# Patient Record
Sex: Male | Born: 1950 | Race: Black or African American | Hispanic: No | Marital: Married | Smoking: Never smoker
Health system: Southern US, Community
[De-identification: ages and names within clinical notes are randomized; demographics above are authoritative.]

## PROBLEM LIST (undated history)

## (undated) DIAGNOSIS — R600 Localized edema: Secondary | ICD-10-CM

## (undated) DIAGNOSIS — F329 Major depressive disorder, single episode, unspecified: Secondary | ICD-10-CM

## (undated) DIAGNOSIS — I639 Cerebral infarction, unspecified: Secondary | ICD-10-CM

## (undated) DIAGNOSIS — M51369 Other intervertebral disc degeneration, lumbar region without mention of lumbar back pain or lower extremity pain: Secondary | ICD-10-CM

## (undated) DIAGNOSIS — C61 Malignant neoplasm of prostate: Principal | ICD-10-CM

## (undated) DIAGNOSIS — M5136 Other intervertebral disc degeneration, lumbar region: Secondary | ICD-10-CM

## (undated) DIAGNOSIS — N189 Chronic kidney disease, unspecified: Secondary | ICD-10-CM

## (undated) DIAGNOSIS — I10 Essential (primary) hypertension: Secondary | ICD-10-CM

## (undated) DIAGNOSIS — K219 Gastro-esophageal reflux disease without esophagitis: Secondary | ICD-10-CM

## (undated) DIAGNOSIS — F32A Depression, unspecified: Secondary | ICD-10-CM

## (undated) DIAGNOSIS — Z923 Personal history of irradiation: Secondary | ICD-10-CM

## (undated) DIAGNOSIS — E785 Hyperlipidemia, unspecified: Secondary | ICD-10-CM

## (undated) HISTORY — DX: Personal history of irradiation: Z92.3

---
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
## 2005-09-02 ENCOUNTER — Emergency Department (HOSPITAL_COMMUNITY): Admission: EM | Admit: 2005-09-02 | Discharge: 2005-09-02 | Payer: Self-pay | Admitting: Emergency Medicine

## 2006-05-20 ENCOUNTER — Emergency Department (HOSPITAL_COMMUNITY): Admission: EM | Admit: 2006-05-20 | Discharge: 2006-05-20 | Payer: Self-pay | Admitting: Emergency Medicine

## 2007-04-10 ENCOUNTER — Emergency Department (HOSPITAL_COMMUNITY): Admission: EM | Admit: 2007-04-10 | Discharge: 2007-04-10 | Payer: Self-pay | Admitting: Emergency Medicine

## 2010-10-05 ENCOUNTER — Emergency Department (HOSPITAL_COMMUNITY): Payer: 59

## 2010-10-05 ENCOUNTER — Emergency Department (HOSPITAL_COMMUNITY)
Admission: EM | Admit: 2010-10-05 | Discharge: 2010-10-05 | Disposition: A | Discharge status: Home / Self Care | Payer: 59 | Attending: Emergency Medicine | Admitting: Emergency Medicine

## 2010-10-05 DIAGNOSIS — I10 Essential (primary) hypertension: Secondary | ICD-10-CM | POA: Insufficient documentation

## 2010-10-05 DIAGNOSIS — G8929 Other chronic pain: Secondary | ICD-10-CM | POA: Insufficient documentation

## 2010-10-05 DIAGNOSIS — E1169 Type 2 diabetes mellitus with other specified complication: Secondary | ICD-10-CM | POA: Insufficient documentation

## 2010-10-05 LAB — URINALYSIS, ROUTINE W REFLEX MICROSCOPIC
Ketones, ur: NEGATIVE mg/dL
Leukocytes, UA: NEGATIVE
Nitrite: NEGATIVE
Specific Gravity, Urine: 1.02 (ref 1.005–1.030)

## 2010-10-05 LAB — CBC
HCT: 43 % (ref 39.0–52.0)
Hemoglobin: 14.4 g/dL (ref 13.0–17.0)
MCV: 76.4 fL — ABNORMAL LOW (ref 78.0–100.0)
Platelets: 228 10*3/uL (ref 150–400)
RDW: 12.6 % (ref 11.5–15.5)

## 2010-10-05 LAB — BASIC METABOLIC PANEL
BUN: 14 mg/dL (ref 6–23)
CO2: 33 mEq/L — ABNORMAL HIGH (ref 19–32)
Chloride: 96 mEq/L (ref 96–112)
GFR calc Af Amer: >60 mL/min (ref >60)
Glucose, Bld: 377 mg/dL — ABNORMAL HIGH (ref 70–99)
Potassium: 3.7 mEq/L (ref 3.5–5.1)
Sodium: 135 mEq/L (ref 135–145)

## 2010-10-05 LAB — DIFFERENTIAL
Basophils Relative: 0 % (ref 0–1)
Neutro Abs: 2.1 10*3/uL (ref 1.7–7.7)

## 2011-03-04 LAB — BASIC METABOLIC PANEL
Calcium: 9.3
GFR calc non Af Amer: >60
Potassium: 4.4
Sodium: 135

## 2011-07-09 ENCOUNTER — Emergency Department (HOSPITAL_COMMUNITY)
Admission: EM | Admit: 2011-07-09 | Discharge: 2011-07-09 | Disposition: A | Discharge status: Home / Self Care | Payer: 59 | Attending: Emergency Medicine | Admitting: Emergency Medicine

## 2011-07-09 ENCOUNTER — Encounter (HOSPITAL_COMMUNITY): Payer: Self-pay | Admitting: Emergency Medicine

## 2011-07-09 ENCOUNTER — Emergency Department (HOSPITAL_COMMUNITY): Payer: 59

## 2011-07-09 DIAGNOSIS — K1379 Other lesions of oral mucosa: Secondary | ICD-10-CM

## 2011-07-09 DIAGNOSIS — R05 Cough: Secondary | ICD-10-CM

## 2011-07-09 DIAGNOSIS — I10 Essential (primary) hypertension: Secondary | ICD-10-CM | POA: Insufficient documentation

## 2011-07-09 DIAGNOSIS — R059 Cough, unspecified: Secondary | ICD-10-CM | POA: Insufficient documentation

## 2011-07-09 DIAGNOSIS — Z79899 Other long term (current) drug therapy: Secondary | ICD-10-CM | POA: Insufficient documentation

## 2011-07-09 DIAGNOSIS — K1370 Unspecified lesions of oral mucosa: Secondary | ICD-10-CM | POA: Insufficient documentation

## 2011-07-09 DIAGNOSIS — E119 Type 2 diabetes mellitus without complications: Secondary | ICD-10-CM | POA: Insufficient documentation

## 2011-07-09 HISTORY — DX: Essential (primary) hypertension: I10

## 2011-07-09 MED ORDER — AMOXICILLIN 500 MG PO CAPS: 500.0000 mg | ORAL_CAPSULE | Freq: Three times a day (TID) | ORAL | Status: AC

## 2011-07-09 MED ORDER — DEXAMETHASONE 6 MG PO TABS: ORAL_TABLET | ORAL | Status: AC

## 2011-07-09 MED ORDER — PROMETHAZINE-CODEINE 6.25-10 MG/5ML PO SYRP
ORAL_SOLUTION | ORAL | Status: DC
Start: 2011-07-09 — End: 2012-06-04

## 2011-07-09 NOTE — ED Provider Notes (Signed)
History     CSN: [id number]  Arrival date & time 07/09/11  1639   First MD Initiated Contact with Patient 07/09/11 1758      Chief Complaint  Patient presents with  . Cough    (Consider location/radiation/quality/duration/timing/severity/associated sxs/prior treatment) HPI Comments: Patient reports 6 weeks of cough. He denies any hemoptysis. He denies fever. He denies any surgery or procedures on the neck or throat. He has had mild nasal congestion. He states he has tried various medications over the counter, without success. He presents at this point for evaluation of his cough.  The history is provided by the patient.    Past Medical History  Diagnosis Date  . Hypertension   . Diabetes mellitus     History reviewed. No pertinent past surgical history.  No family history on file.  History  Substance Use Topics  . Smoking status: Never Smoker   . Smokeless tobacco: Not on file  . Alcohol Use: No      Review of Systems  Constitutional: Negative for activity change.       All ROS Neg except as noted in HPI  HENT: Positive for postnasal drip. Negative for nosebleeds and neck pain.   Eyes: Negative for photophobia and discharge.  Respiratory: Positive for cough. Negative for shortness of breath and wheezing.   Cardiovascular: Negative for chest pain and palpitations.  Gastrointestinal: Negative for abdominal pain and blood in stool.  Genitourinary: Negative for dysuria, frequency and hematuria.  Musculoskeletal: Negative for back pain and arthralgias.  Skin: Negative.   Neurological: Negative for dizziness, seizures and speech difficulty.  Psychiatric/Behavioral: Negative for hallucinations and confusion.    Allergies  Review of patient's allergies indicates no known allergies.  Home Medications   Current Outpatient Rx  Name Route Sig Dispense Refill  . AMLODIPINE BESYLATE 5 MG PO TABS Oral Take 5 mg by mouth daily.    . ASPIRIN EC 81 MG PO TBEC Oral Take 81  mg by mouth every morning.    Marland Kitchen BENZONATATE 100 MG PO CAPS Oral Take 100 mg by mouth every 8 (eight) hours as needed. For cough    . GLIPIZIDE ER 10 MG PO TB24 Oral Take 10 mg by mouth every morning.    . INSULIN ISOPHANE HUMAN 100 UNIT/ML [place] SUSP Subcutaneous Inject 10 Units into the skin at bedtime.    Marland Kitchen LISINOPRIL-HYDROCHLOROTHIAZIDE 20-12.5 MG PO TABS Oral Take 1 tablet by mouth 2 (two) times daily.    Marland Kitchen METFORMIN HCL ER 500 MG PO TB24 Oral Take 500 mg by mouth 2 (two) times daily.    Marland Kitchen SIMVASTATIN 20 MG PO TABS Oral Take 20 mg by mouth at bedtime.      BP 172/63  Pulse 97  Temp 98.3 F (36.8 C)  Resp 20  Ht 5' 8.5" (1.74 m)  Wt 190 lb (86.183 kg)  BMI 28.47 kg/m2  SpO2 98%  Physical Exam  Nursing note and vitals reviewed. Constitutional: He is oriented to person, place, and time. He appears well-developed and well-nourished.  Non-toxic appearance.  HENT:  Head: Normocephalic.  Right Ear: Tympanic membrane and external ear normal.  Left Ear: Tympanic membrane and external ear normal.       There is no increased redness of the posterior pharynx. The uvula however is elongated. The airway is patent. There is some mild nasal congestion present.  Eyes: EOM and lids are normal. Pupils are equal, round, and reactive to light.  Neck: Normal range of motion.  Neck supple. Carotid bruit is not present.  Cardiovascular: Normal rate, regular rhythm, normal heart sounds, intact distal pulses and normal pulses.   Pulmonary/Chest: Effort normal and breath sounds normal. No respiratory distress. He has no wheezes. He has no rales.  Abdominal: Soft. Bowel sounds are normal. There is no tenderness. There is no guarding.  Musculoskeletal: Normal range of motion.  Lymphadenopathy:       Head (right side): No submandibular adenopathy present.       Head (left side): No submandibular adenopathy present.    He has no cervical adenopathy.  Neurological: He is alert and oriented to person, place, and  time. He has normal strength. No cranial nerve deficit or sensory deficit.  Skin: Skin is warm and dry.  Psychiatric: He has a normal mood and affect. His speech is normal.    ED Course  Procedures (including critical care time)  Labs Reviewed - No data to display No results found.   No diagnosis found.    MDM  I have reviewed nursing notes, vital signs, and all appropriate lab and imaging results for this patient. The chest x-ray is within normal limits. Suspect the source of the cough is an elongated uvula. Patient advised to use dexamethasone for  6 days only. Promethazine cough medication at bedtime. Amoxicillin 500 mg 3 times daily. Patient further advised to see the ENT specialist if not improving.       Kathie Dike, PA 07/09/11 1852  Kathie Dike, [place] 07/09/11 [phone]

## 2011-07-09 NOTE — ED Provider Notes (Signed)
Medical screening examination/treatment/procedure(s) were performed by non-physician practitioner and as supervising physician I was immediately available for consultation/collaboration.  Jumar Greenstreet S. Jasnoor Trussell, MD 07/09/11 2153 

## 2011-07-09 NOTE — ED Notes (Signed)
Patient transported to X-ray 

## 2011-07-09 NOTE — ED Notes (Signed)
Cough for 6 weeks. Feels like"coming from my throat;".  Does not feel he has chest congestion.  Alert, Denies nasal congestion.

## 2011-07-09 NOTE — ED Notes (Signed)
Pt c/o chronic cough x 6 weeks. Denies n/v/d.

## 2011-07-09 NOTE — Discharge Instructions (Signed)
Your chest x-ray is within normal limits. Here vital signs are stable. Your oxygen level is within normal limits. It appears the source of your cough is from your uvula in the back of your throat. Please use the medications prescribed, and if not improving in 5-6 days please see the specialists listed above for additional assistance.

## 2012-03-25 HISTORY — PX: PROSTATE BIOPSY: SHX241

## 2012-03-29 ENCOUNTER — Other Ambulatory Visit (HOSPITAL_COMMUNITY): Payer: Self-pay | Admitting: Urology

## 2012-03-29 DIAGNOSIS — C61 Malignant neoplasm of prostate: Secondary | ICD-10-CM

## 2012-04-12 ENCOUNTER — Encounter (HOSPITAL_COMMUNITY)
Admission: RE | Admit: 2012-04-12 | Discharge: 2012-04-12 | Disposition: A | Discharge status: Home / Self Care | Payer: 59 | Source: Ambulatory Visit | Attending: Urology | Admitting: Urology

## 2012-04-12 DIAGNOSIS — C61 Malignant neoplasm of prostate: Secondary | ICD-10-CM | POA: Insufficient documentation

## 2012-04-12 MED ORDER — TECHNETIUM TC 99M MEDRONATE IV KIT
25.0000 | PACK | Freq: Once | INTRAVENOUS | Status: AC | PRN
  Administered 2012-04-12: 25 via INTRAVENOUS

## 2012-04-15 ENCOUNTER — Other Ambulatory Visit (HOSPITAL_COMMUNITY): Payer: Self-pay | Admitting: Urology

## 2012-04-15 ENCOUNTER — Ambulatory Visit (HOSPITAL_COMMUNITY)
Admission: RE | Admit: 2012-04-15 | Discharge: 2012-04-15 | Disposition: A | Discharge status: Home / Self Care | Payer: 59 | Source: Ambulatory Visit | Attending: Urology | Admitting: Urology

## 2012-04-15 DIAGNOSIS — M51379 Other intervertebral disc degeneration, lumbosacral region without mention of lumbar back pain or lower extremity pain: Secondary | ICD-10-CM | POA: Insufficient documentation

## 2012-04-15 DIAGNOSIS — M549 Dorsalgia, unspecified: Secondary | ICD-10-CM

## 2012-04-15 DIAGNOSIS — M545 Low back pain, unspecified: Secondary | ICD-10-CM | POA: Insufficient documentation

## 2012-04-15 DIAGNOSIS — M5137 Other intervertebral disc degeneration, lumbosacral region: Secondary | ICD-10-CM | POA: Insufficient documentation

## 2012-04-26 ENCOUNTER — Other Ambulatory Visit: Payer: Self-pay | Admitting: Urology

## 2012-06-03 ENCOUNTER — Encounter (HOSPITAL_COMMUNITY): Payer: Self-pay | Admitting: Pharmacy Technician

## 2012-06-04 NOTE — Patient Instructions (Addendum)
Jim [name]  06/04/2012   Your procedure is scheduled on: 06/11/12   Report to [facility] at 0515 AM.  Call this number if you have problems the morning of surgery: [phone]   Remember:   Do not eat food or drink liquids after midnight.   Take these medicines the morning of surgery with A SIP OF WATER:    Do not wear jewelry,   Do not wear lotions, powders, or perfumes.    . Men may shave face and neck.  Do not bring valuables to the hospital.  Contacts, dentures or bridgework may not be worn into surgery.  Leave suitcase in the car. After surgery it may be brought to your room.  For patients admitted to the hospital, checkout time is 11:00 AM the day of  discharge.   SEE CHG INSTRUCTION SHEET    Please read over the following fact sheets that you were given: MRSA Information, coughing and deep breathing exercises, leg exercises, Blood Transfusion Fact Sheet                Failure to comply with these instructions may result in cancellation of your surgery.                  Patient Signature _________________________________               Nurse Signature _________________________________

## 2012-06-07 ENCOUNTER — Encounter (HOSPITAL_COMMUNITY)
Admission: RE | Admit: 2012-06-07 | Discharge: 2012-06-07 | Disposition: A | Discharge status: Home / Self Care | Payer: 59 | Source: Ambulatory Visit | Attending: Urology | Admitting: Urology

## 2012-06-07 ENCOUNTER — Encounter (HOSPITAL_COMMUNITY): Payer: Self-pay

## 2012-06-07 HISTORY — DX: Other intervertebral disc degeneration, lumbar region: M51.36

## 2012-06-07 HISTORY — DX: Chronic kidney disease, unspecified: N18.9

## 2012-06-07 HISTORY — DX: Other intervertebral disc degeneration, lumbar region without mention of lumbar back pain or lower extremity pain: M51.369

## 2012-06-07 HISTORY — DX: Gastro-esophageal reflux disease without esophagitis: K21.9

## 2012-06-07 LAB — BASIC METABOLIC PANEL
BUN: 17 mg/dL (ref 6–23)
Calcium: 9.5 mg/dL (ref 8.4–10.5)
GFR calc Af Amer: 77 mL/min — ABNORMAL LOW (ref >90)
GFR calc non Af Amer: 67 mL/min — ABNORMAL LOW (ref >90)
Glucose, Bld: 188 mg/dL — ABNORMAL HIGH (ref 70–99)
Potassium: 4 mEq/L (ref 3.5–5.1)
Sodium: 138 mEq/L (ref 135–145)

## 2012-06-07 LAB — SURGICAL PCR SCREEN
MRSA, PCR: NEGATIVE
Staphylococcus aureus: NEGATIVE

## 2012-06-07 LAB — CBC
MCH: 26.2 pg (ref 26.0–34.0)
MCHC: 33.9 g/dL (ref 30.0–36.0)
RDW: 12.5 % (ref 11.5–15.5)

## 2012-06-07 NOTE — Progress Notes (Signed)
06/07/12 1242  OBSTRUCTIVE SLEEP APNEA  Have you ever been diagnosed with sleep apnea through a sleep study? No  Do you snore loudly (loud enough to be heard through closed doors)?  1  Do you often feel tired, fatigued, or sleepy during the daytime? 1  Has anyone observed you stop breathing during your sleep? 1  Do you have, or are you being treated for high blood pressure? 1  BMI more than 35 kg/m2? 0  Age over 64 years old? 1  Neck circumference greater than 40 cm/18 inches? 0  Gender: 1  Obstructive Sleep Apnea Score 6

## 2012-06-11 ENCOUNTER — Inpatient Hospital Stay (HOSPITAL_COMMUNITY): Payer: 59 | Admitting: Anesthesiology

## 2012-06-11 ENCOUNTER — Encounter (HOSPITAL_COMMUNITY)
Admission: RE | Disposition: A | Discharge status: Home / Self Care | Payer: Self-pay | Source: Ambulatory Visit | Attending: Urology

## 2012-06-11 ENCOUNTER — Encounter (HOSPITAL_COMMUNITY): Payer: Self-pay | Admitting: *Deleted

## 2012-06-11 ENCOUNTER — Encounter (HOSPITAL_COMMUNITY): Payer: Self-pay | Admitting: Anesthesiology

## 2012-06-11 ENCOUNTER — Inpatient Hospital Stay (HOSPITAL_COMMUNITY)
Admission: RE | Admit: 2012-06-11 | Discharge: 2012-06-13 | DRG: 708 | Disposition: A | Discharge status: Home / Self Care | Payer: 59 | Source: Ambulatory Visit | Attending: Urology | Admitting: Urology

## 2012-06-11 DIAGNOSIS — Z01812 Encounter for preprocedural laboratory examination: Secondary | ICD-10-CM

## 2012-06-11 DIAGNOSIS — E119 Type 2 diabetes mellitus without complications: Secondary | ICD-10-CM | POA: Diagnosis present

## 2012-06-11 DIAGNOSIS — K219 Gastro-esophageal reflux disease without esophagitis: Secondary | ICD-10-CM | POA: Diagnosis present

## 2012-06-11 DIAGNOSIS — N281 Cyst of kidney, acquired: Secondary | ICD-10-CM | POA: Diagnosis present

## 2012-06-11 DIAGNOSIS — Z79899 Other long term (current) drug therapy: Secondary | ICD-10-CM

## 2012-06-11 DIAGNOSIS — C61 Malignant neoplasm of prostate: Principal | ICD-10-CM | POA: Diagnosis present

## 2012-06-11 DIAGNOSIS — I10 Essential (primary) hypertension: Secondary | ICD-10-CM | POA: Diagnosis present

## 2012-06-11 DIAGNOSIS — M5137 Other intervertebral disc degeneration, lumbosacral region: Secondary | ICD-10-CM | POA: Diagnosis present

## 2012-06-11 DIAGNOSIS — M51379 Other intervertebral disc degeneration, lumbosacral region without mention of lumbar back pain or lower extremity pain: Secondary | ICD-10-CM | POA: Diagnosis present

## 2012-06-11 DIAGNOSIS — G8929 Other chronic pain: Secondary | ICD-10-CM | POA: Diagnosis present

## 2012-06-11 DIAGNOSIS — Z794 Long term (current) use of insulin: Secondary | ICD-10-CM

## 2012-06-11 DIAGNOSIS — E785 Hyperlipidemia, unspecified: Secondary | ICD-10-CM | POA: Diagnosis present

## 2012-06-11 HISTORY — PX: ROBOTIC ASSITED PARTIAL NEPHRECTOMY: SHX6087

## 2012-06-11 HISTORY — PX: LYMPHADENECTOMY: SHX5960

## 2012-06-11 HISTORY — PX: ROBOT ASSISTED LAPAROSCOPIC RADICAL PROSTATECTOMY: SHX5141

## 2012-06-11 HISTORY — DX: Malignant neoplasm of prostate: C61

## 2012-06-11 LAB — GLUCOSE, CAPILLARY
Glucose-Capillary: 157 mg/dL — ABNORMAL HIGH (ref 70–99)
Glucose-Capillary: 200 mg/dL — ABNORMAL HIGH (ref 70–99)

## 2012-06-11 LAB — BASIC METABOLIC PANEL
BUN: 16 mg/dL (ref 6–23)
Chloride: 100 mEq/L (ref 96–112)
Creatinine, Ser: 1.23 mg/dL (ref 0.50–1.35)
GFR calc non Af Amer: 61 mL/min — ABNORMAL LOW (ref >90)
Glucose, Bld: 221 mg/dL — ABNORMAL HIGH (ref 70–99)
Potassium: 3.2 mEq/L — ABNORMAL LOW (ref 3.5–5.1)

## 2012-06-11 LAB — TYPE AND SCREEN

## 2012-06-11 LAB — ABO/RH: ABO/RH(D): O POS

## 2012-06-11 SURGERY — ROBOTIC ASSISTED LAPAROSCOPIC RADICAL PROSTATECTOMY
Anesthesia: General | Wound class: Clean Contaminated

## 2012-06-11 MED ORDER — MIDAZOLAM HCL 5 MG/5ML IJ SOLN
INTRAMUSCULAR | Status: DC | PRN
  Administered 2012-06-11: 2 mg via INTRAVENOUS

## 2012-06-11 MED ORDER — METOPROLOL TARTRATE 1 MG/ML IV SOLN
2.0000 mg | Freq: Once | INTRAVENOUS | Status: AC
  Administered 2012-06-11: 2 mg via INTRAVENOUS

## 2012-06-11 MED ORDER — POTASSIUM CHLORIDE IN NACL 20-0.9 MEQ/L-% IV SOLN
INTRAVENOUS | Status: DC
  Administered 2012-06-11 – 2012-06-13 (×4): via INTRAVENOUS
  Filled 2012-06-11 (×5): qty 1000

## 2012-06-11 MED ORDER — LACTATED RINGERS IV SOLN
INTRAVENOUS | Status: DC | PRN
  Administered 2012-06-11 (×3): via INTRAVENOUS
  Administered 2012-06-11: 1000 mL

## 2012-06-11 MED ORDER — INDOCYANINE GREEN 25 MG IV SOLR
INTRAVENOUS | Status: DC | PRN
  Administered 2012-06-11: .8 mg via INTRAVENOUS

## 2012-06-11 MED ORDER — AMLODIPINE BESYLATE 10 MG PO TABS
10.0000 mg | ORAL_TABLET | Freq: Every morning | ORAL | Status: DC
Start: 2012-06-12 — End: 2012-06-13
  Administered 2012-06-12 – 2012-06-13 (×2): 10 mg via ORAL
  Filled 2012-06-11 (×2): qty 1

## 2012-06-11 MED ORDER — DOCUSATE SODIUM 100 MG PO CAPS
100.0000 mg | ORAL_CAPSULE | Freq: Two times a day (BID) | ORAL | Status: DC
  Administered 2012-06-11 – 2012-06-13 (×4): 100 mg via ORAL
  Filled 2012-06-11 (×5): qty 1

## 2012-06-11 MED ORDER — HYDROMORPHONE HCL PF 1 MG/ML IJ SOLN
0.2500 mg | INTRAMUSCULAR | Status: DC | PRN
  Administered 2012-06-11: 0.5 mg via INTRAVENOUS

## 2012-06-11 MED ORDER — INSULIN ASPART 100 UNIT/ML ~~LOC~~ SOLN: 0.0000 [IU] | Freq: Every day | SUBCUTANEOUS | Status: DC

## 2012-06-11 MED ORDER — LOSARTAN POTASSIUM 50 MG PO TABS
50.0000 mg | ORAL_TABLET | Freq: Every day | ORAL | Status: DC
  Administered 2012-06-12 – 2012-06-13 (×2): 50 mg via ORAL
  Filled 2012-06-11 (×2): qty 1

## 2012-06-11 MED ORDER — HEPARIN SODIUM (PORCINE) 5000 UNIT/ML IJ SOLN
5000.0000 [IU] | Freq: Three times a day (TID) | INTRAMUSCULAR | Status: DC
  Administered 2012-06-11 – 2012-06-13 (×5): 5000 [IU] via SUBCUTANEOUS
  Filled 2012-06-11 (×8): qty 1

## 2012-06-11 MED ORDER — NEOSTIGMINE METHYLSULFATE 1 MG/ML IJ SOLN
INTRAMUSCULAR | Status: DC | PRN
  Administered 2012-06-11: 4 mg via INTRAVENOUS

## 2012-06-11 MED ORDER — ESMOLOL HCL 10 MG/ML IV SOLN
INTRAVENOUS | Status: DC | PRN
  Administered 2012-06-11: 30 mg via INTRAVENOUS

## 2012-06-11 MED ORDER — ONDANSETRON HCL 4 MG/2ML IJ SOLN
INTRAMUSCULAR | Status: DC | PRN
  Administered 2012-06-11: 4 mg via INTRAVENOUS

## 2012-06-11 MED ORDER — SIMVASTATIN 20 MG PO TABS
20.0000 mg | ORAL_TABLET | Freq: Every day | ORAL | Status: DC
  Administered 2012-06-11 – 2012-06-12 (×2): 20 mg via ORAL
  Filled 2012-06-11 (×3): qty 1

## 2012-06-11 MED ORDER — INSULIN ASPART 100 UNIT/ML ~~LOC~~ SOLN
0.0000 [IU] | Freq: Three times a day (TID) | SUBCUTANEOUS | Status: DC
  Administered 2012-06-11 – 2012-06-12 (×2): 3 [IU] via SUBCUTANEOUS
  Administered 2012-06-12 – 2012-06-13 (×3): 2 [IU] via SUBCUTANEOUS

## 2012-06-11 MED ORDER — ONDANSETRON HCL 4 MG/2ML IJ SOLN: 4.0000 mg | INTRAMUSCULAR | Status: DC | PRN

## 2012-06-11 MED ORDER — CISATRACURIUM BESYLATE (PF) 10 MG/5ML IV SOLN
INTRAVENOUS | Status: DC | PRN
  Administered 2012-06-11: 16 mg via INTRAVENOUS
  Administered 2012-06-11 (×2): 2 mg via INTRAVENOUS
  Administered 2012-06-11 (×4): 4 mg via INTRAVENOUS
  Administered 2012-06-11: 6 mg via INTRAVENOUS

## 2012-06-11 MED ORDER — LACTATED RINGERS IR SOLN
Status: DC | PRN
  Administered 2012-06-11: 1000 mL

## 2012-06-11 MED ORDER — HYDROCHLOROTHIAZIDE 12.5 MG PO CAPS
12.5000 mg | ORAL_CAPSULE | Freq: Every day | ORAL | Status: DC
  Administered 2012-06-12 – 2012-06-13 (×2): 12.5 mg via ORAL
  Filled 2012-06-11 (×2): qty 1

## 2012-06-11 MED ORDER — LIDOCAINE HCL (CARDIAC) 20 MG/ML IV SOLN
INTRAVENOUS | Status: DC | PRN
  Administered 2012-06-11: 100 mg via INTRAVENOUS

## 2012-06-11 MED ORDER — OXYCODONE HCL 5 MG PO TABS
5.0000 mg | ORAL_TABLET | ORAL | Status: DC | PRN
  Administered 2012-06-11 – 2012-06-12 (×4): 5 mg via ORAL
  Filled 2012-06-11 (×4): qty 1

## 2012-06-11 MED ORDER — SODIUM CHLORIDE 0.9 % IV BOLUS (SEPSIS)
1000.0000 mL | Freq: Once | INTRAVENOUS | Status: AC
  Administered 2012-06-11: 1000 mL via INTRAVENOUS

## 2012-06-11 MED ORDER — INDIGOTINDISULFONATE SODIUM 8 MG/ML IJ SOLN
INTRAMUSCULAR | Status: DC | PRN
  Administered 2012-06-11: 5 mL via INTRAVENOUS

## 2012-06-11 MED ORDER — HYDROMORPHONE HCL PF 1 MG/ML IJ SOLN
INTRAMUSCULAR | Status: DC | PRN
  Administered 2012-06-11 (×2): 1 mg via INTRAVENOUS

## 2012-06-11 MED ORDER — BUPIVACAINE LIPOSOME 1.3 % IJ SUSP
20.0000 mL | INTRAMUSCULAR | Status: AC
  Administered 2012-06-11: 20 mL
  Filled 2012-06-11: qty 20

## 2012-06-11 MED ORDER — PHENYLEPHRINE HCL 10 MG/ML IJ SOLN
INTRAMUSCULAR | Status: DC | PRN
  Administered 2012-06-11: 80 ug via INTRAVENOUS
  Administered 2012-06-11: 40 ug via INTRAVENOUS
  Administered 2012-06-11 (×2): 80 ug via INTRAVENOUS
  Administered 2012-06-11: 40 ug via INTRAVENOUS
  Administered 2012-06-11: 80 ug via INTRAVENOUS

## 2012-06-11 MED ORDER — LOSARTAN POTASSIUM-HCTZ 50-12.5 MG PO TABS: 1.0000 | ORAL_TABLET | Freq: Every morning | ORAL | Status: DC

## 2012-06-11 MED ORDER — INSULIN ASPART 100 UNIT/ML ~~LOC~~ SOLN
SUBCUTANEOUS | Status: DC | PRN
  Administered 2012-06-11: 10 [IU] via SUBCUTANEOUS

## 2012-06-11 MED ORDER — SENNA 8.6 MG PO TABS
1.0000 | ORAL_TABLET | Freq: Two times a day (BID) | ORAL | Status: DC
  Administered 2012-06-11 – 2012-06-13 (×4): 8.6 mg via ORAL
  Filled 2012-06-11 (×4): qty 1

## 2012-06-11 MED ORDER — HYDROMORPHONE HCL PF 1 MG/ML IJ SOLN
0.5000 mg | INTRAMUSCULAR | Status: DC | PRN
  Administered 2012-06-11 – 2012-06-13 (×5): 1 mg via INTRAVENOUS
  Filled 2012-06-11 (×5): qty 1

## 2012-06-11 MED ORDER — ACETAMINOPHEN 10 MG/ML IV SOLN
1000.0000 mg | Freq: Four times a day (QID) | INTRAVENOUS | Status: AC
  Administered 2012-06-11 – 2012-06-12 (×4): 1000 mg via INTRAVENOUS
  Filled 2012-06-11 (×4): qty 100

## 2012-06-11 MED ORDER — STERILE WATER FOR IRRIGATION IR SOLN
Status: DC | PRN
  Administered 2012-06-11: 3000 mL

## 2012-06-11 MED ORDER — ACETAMINOPHEN 10 MG/ML IV SOLN
INTRAVENOUS | Status: DC | PRN
  Administered 2012-06-11: 1000 mg via INTRAVENOUS

## 2012-06-11 MED ORDER — GLYCOPYRROLATE 0.2 MG/ML IJ SOLN
INTRAMUSCULAR | Status: DC | PRN
  Administered 2012-06-11: .5 mg via INTRAVENOUS

## 2012-06-11 MED ORDER — SUFENTANIL CITRATE 50 MCG/ML IV SOLN
INTRAVENOUS | Status: DC | PRN
  Administered 2012-06-11 (×13): 10 ug via INTRAVENOUS

## 2012-06-11 MED ORDER — PROMETHAZINE HCL 25 MG/ML IJ SOLN: 6.2500 mg | INTRAMUSCULAR | Status: DC | PRN

## 2012-06-11 MED ORDER — PROPOFOL 10 MG/ML IV BOLUS
INTRAVENOUS | Status: DC | PRN
  Administered 2012-06-11: 200 mg via INTRAVENOUS

## 2012-06-11 MED ORDER — CEFAZOLIN SODIUM-DEXTROSE 2-3 GM-% IV SOLR
2.0000 g | INTRAVENOUS | Status: AC
  Administered 2012-06-11: 2 g via INTRAVENOUS

## 2012-06-11 SURGICAL SUPPLY — 96 items
ADH SKN CLS APL DERMABOND .7 (GAUZE/BANDAGES/DRESSINGS) ×6
APL ESCP 34 STRL LF DISP (HEMOSTASIS)
APPLICATOR SURGIFLO ENDO (HEMOSTASIS) IMPLANT
BAG SPEC RTRVL LRG 6X4 10 (ENDOMECHANICALS) ×3
CANISTER SUCTION 2500CC (MISCELLANEOUS) ×4 IMPLANT
CANNULA SEAL DVNC (CANNULA) ×9 IMPLANT
CANNULA SEALS DA VINCI (CANNULA) ×2
CATH FOLEY 2WAY SLVR 18FR 30CC (CATHETERS) ×4 IMPLANT
CATH ROBINSON RED A/P 16FR (CATHETERS) ×4 IMPLANT
CATH ROBINSON RED A/P 8FR (CATHETERS) ×4 IMPLANT
CATH TIEMANN FOLEY 18FR 5CC (CATHETERS) ×4 IMPLANT
CHLORAPREP W/TINT 26ML (MISCELLANEOUS) ×5 IMPLANT
CLIP LIGATING HEM O LOK PURPLE (MISCELLANEOUS) ×8 IMPLANT
CLIP LIGATING HEMO LOK XL GOLD (MISCELLANEOUS) ×8 IMPLANT
CLIP LIGATING HEMO O LOK GREEN (MISCELLANEOUS) IMPLANT
CLOTH BEACON ORANGE TIMEOUT ST (SAFETY) ×4 IMPLANT
CORD HIGH FREQUENCY UNIPOLAR (ELECTROSURGICAL) ×5 IMPLANT
CORDS BIPOLAR (ELECTRODE) ×4 IMPLANT
COVER SURGICAL LIGHT HANDLE (MISCELLANEOUS) ×4 IMPLANT
COVER TIP SHEARS 8 DVNC (MISCELLANEOUS) ×3 IMPLANT
COVER TIP SHEARS 8MM DA VINCI (MISCELLANEOUS) ×1
CUTTER ECHEON FLEX ENDO 45 340 (ENDOMECHANICALS) ×4 IMPLANT
CUTTER FLEX LINEAR 45M (STAPLE) ×4 IMPLANT
DECANTER SPIKE VIAL GLASS SM (MISCELLANEOUS) ×3 IMPLANT
DERMABOND ADVANCED (GAUZE/BANDAGES/DRESSINGS) ×2
DERMABOND ADVANCED .7 DNX12 (GAUZE/BANDAGES/DRESSINGS) ×6 IMPLANT
DRAIN CHANNEL 15F RND FF 3/16 (WOUND CARE) ×4 IMPLANT
DRAPE INCISE IOBAN 66X45 STRL (DRAPES) ×5 IMPLANT
DRAPE LAPAROSCOPIC ABDOMINAL (DRAPES) ×4 IMPLANT
DRAPE LG THREE QUARTER DISP (DRAPES) ×10 IMPLANT
DRAPE SURG IRRIG POUCH 19X23 (DRAPES) ×4 IMPLANT
DRAPE TABLE BACK 44X90 PK DISP (DRAPES) ×5 IMPLANT
DRAPE WARM FLUID 44X44 (DRAPE) ×4 IMPLANT
DRSG TEGADERM 2-3/8X2-3/4 SM (GAUZE/BANDAGES/DRESSINGS) ×22 IMPLANT
DRSG TEGADERM 4X4.75 (GAUZE/BANDAGES/DRESSINGS) ×8 IMPLANT
DRSG TEGADERM 6X8 (GAUZE/BANDAGES/DRESSINGS) ×9 IMPLANT
ELECT REM PT RETURN 9FT ADLT (ELECTROSURGICAL) ×8
ELECTRODE REM PT RTRN 9FT ADLT (ELECTROSURGICAL) ×6 IMPLANT
EVACUATOR SILICONE 100CC (DRAIN) ×4 IMPLANT
GAUZE SPONGE 2X2 8PLY STRL LF (GAUZE/BANDAGES/DRESSINGS) ×3 IMPLANT
GAUZE VASELINE 3X9 (GAUZE/BANDAGES/DRESSINGS) IMPLANT
GLOVE BIO SURGEON STRL SZ 6.5 (GLOVE) ×6 IMPLANT
GLOVE BIOGEL M STRL SZ7.5 (GLOVE) ×13 IMPLANT
GOWN STRL NON-REIN LRG LVL3 (GOWN DISPOSABLE) ×13 IMPLANT
GOWN STRL REIN XL XLG (GOWN DISPOSABLE) ×12 IMPLANT
HOLDER FOLEY CATH W/STRAP (MISCELLANEOUS) ×4 IMPLANT
IV LACTATED RINGERS 1000ML (IV SOLUTION) ×4 IMPLANT
KIT ACCESSORY DA VINCI DISP (KITS) ×1
KIT ACCESSORY DVNC DISP (KITS) ×3 IMPLANT
KIT BASIN OR (CUSTOM PROCEDURE TRAY) ×4 IMPLANT
KIT PROCEDURE DA VINCI SI (MISCELLANEOUS) ×1
KIT PROCEDURE DVNC SI (MISCELLANEOUS) IMPLANT
LOOP VESSEL MAXI BLUE (MISCELLANEOUS) ×3 IMPLANT
NDL INSUFFLATION 14GA 120MM (NEEDLE) ×3 IMPLANT
NEEDLE INSUFFLATION 14GA 120MM (NEEDLE) ×4 IMPLANT
NS IRRIG 1000ML POUR BTL (IV SOLUTION) ×4 IMPLANT
PACK ROBOT UROLOGY CUSTOM (CUSTOM PROCEDURE TRAY) ×4 IMPLANT
PENCIL BUTTON HOLSTER BLD 10FT (ELECTRODE) ×5 IMPLANT
POSITIONER SURGICAL ARM (MISCELLANEOUS) ×8 IMPLANT
POUCH SPECIMEN RETRIEVAL 10MM (ENDOMECHANICALS) ×4 IMPLANT
RELOAD 45 VASCULAR/THIN (ENDOMECHANICALS) ×4 IMPLANT
RELOAD GREEN ECHELON 45 (STAPLE) ×3 IMPLANT
RELOAD STAPLE 45 2.5 WHT GRN (ENDOMECHANICALS) ×9 IMPLANT
SEALER TISSUE G2 STRG ARTC 35C (ENDOMECHANICALS) IMPLANT
SET TUBE IRRIG SUCTION NO TIP (IRRIGATION / IRRIGATOR) ×5 IMPLANT
SOLUTION ANTI FOG 6CC (MISCELLANEOUS) ×4 IMPLANT
SOLUTION ELECTROLUBE (MISCELLANEOUS) ×4 IMPLANT
SPONGE GAUZE 2X2 STER 10/PKG (GAUZE/BANDAGES/DRESSINGS)
SPONGE LAP 18X18 X RAY DECT (DISPOSABLE) ×3 IMPLANT
SPONGE LAP 4X18 X RAY DECT (DISPOSABLE) ×4 IMPLANT
SURGIFLO W/THROMBIN 8M KIT (HEMOSTASIS) ×3 IMPLANT
SUT ETHILON 3 0 PS 1 (SUTURE) ×4 IMPLANT
SUT MNCRL AB 4-0 PS2 18 (SUTURE) ×9 IMPLANT
SUT PDS AB 1 CT1 27 (SUTURE) ×5 IMPLANT
SUT PDS AB 1 TP1 54 (SUTURE) ×6 IMPLANT
SUT PDS AB 1 TP1 96 (SUTURE) ×3 IMPLANT
SUT V-LOC BARB 180 2/0GR6 GS22 (SUTURE)
SUT V-LOC BARB 180 2/0GR9 GS23 (SUTURE)
SUT VIC AB 0 CT1 27 (SUTURE) ×4
SUT VIC AB 0 CT1 27XBRD ANTBC (SUTURE) ×6 IMPLANT
SUT VICRYL 0 UR6 27IN ABS (SUTURE) ×9 IMPLANT
SUT VLOC BARB 180 ABS3/0GR12 (SUTURE) ×8
SUTURE V-LC BRB 180 2/0GR6GS22 (SUTURE) IMPLANT
SUTURE V-LC BRB 180 2/0GR9GS23 (SUTURE) IMPLANT
SUTURE VLOC BRB 180 ABS3/0GR12 (SUTURE) ×3 IMPLANT
SYR 20CC LL (SYRINGE) ×9 IMPLANT
SYR BULB IRRIGATION 50ML (SYRINGE) IMPLANT
TOWEL OR 17X26 10 PK STRL BLUE (TOWEL DISPOSABLE) ×2 IMPLANT
TOWEL OR NON WOVEN STRL DISP B (DISPOSABLE) ×6 IMPLANT
TRAY FOLEY CATH 14FRSI W/METER (CATHETERS) ×4 IMPLANT
TRAY LAP CHOLE (CUSTOM PROCEDURE TRAY) ×4 IMPLANT
TROCAR 12M 150ML BLUNT (TROCAR) ×4 IMPLANT
TROCAR ENDOPATH XCEL 12X100 BL (ENDOMECHANICALS) ×4 IMPLANT
TROCAR XCEL 12X100 BLDLESS (ENDOMECHANICALS) ×4 IMPLANT
TUBING INSUFFLATION 10FT LAP (TUBING) ×4 IMPLANT
WATER STERILE IRR 1500ML POUR (IV SOLUTION) ×6 IMPLANT

## 2012-06-11 NOTE — Progress Notes (Signed)
PACU note; pt extubated by Dr. Okey Dupre and suctioned

## 2012-06-11 NOTE — Brief Op Note (Signed)
06/11/2012  1:44 PM  PATIENT:  Jim [name]  62 y.o. male  PRE-OPERATIVE DIAGNOSIS:  LEFT RENAL CYST, PROSTATE CANCER  POST-OPERATIVE DIAGNOSIS:  LEFT RENAL CYST, PROSTATE CANCER  PROCEDURE:  Procedure(s) (LRB) with comments: ROBOTIC ASSISTED LAPAROSCOPIC RADICAL PROSTATECTOMY (N/A) - 1st procedure: Robotic Left Renal Cyst Decortication 2nd: Robotic Prostatectomy, Lymphadenectomy  LYMPHADENECTOMY (Bilateral) ROBOTIC ASSITED PARTIAL NEPHRECTOMY (Left)  SURGEON:  Surgeon(s) and Role:    * Sebastian Ache, MD - Primary  PHYSICIAN ASSISTANT:   ASSISTANTS: Lujean Rave, PA   ANESTHESIA:   general  EBL:  Total I/O In: 2000 [I.V.:2000] Out: 475 [Urine:75; Blood:400]  BLOOD ADMINISTERED:none  DRAINS: JP in LLQ, Foley   LOCAL MEDICATIONS USED:  MARCAINE     SPECIMEN:  Source of Specimen:  1 - Left Large Renal cyst Wall, 2 - Left Smaller Cyst wall, 3 - Prostatectomy, 4 - Bilateral Ext iliac and obturator lymph nodes, 5 - Rt ext iliac sentinal lymph node  DISPOSITION OF SPECIMEN:  PATHOLOGY  COUNTS:  YES  TOURNIQUET:  * No tourniquets in log *  DICTATION: .Other Dictation: Dictation Number  [id number]  PLAN OF CARE: Admit to inpatient   PATIENT DISPOSITION:  PACU - hemodynamically stable.   Delay start of Pharmacological VTE agent (>24hrs) due to surgical blood loss or risk of bleeding: yes

## 2012-06-11 NOTE — Progress Notes (Signed)
PACU note----Dr. Okey Dupre aware of elevated heart rate; order rec'd and med given

## 2012-06-11 NOTE — Transfer of Care (Signed)
Immediate Anesthesia Transfer of Care Note  Patient: Jim [name]  Procedure(s) Performed: Procedure(s) (LRB) with comments: ROBOTIC ASSISTED LAPAROSCOPIC RADICAL PROSTATECTOMY (N/A) - 1st procedure: Robotic Left Renal Cyst Decortication 2nd: Robotic Prostatectomy, Lymphadenectomy  LYMPHADENECTOMY (Bilateral) ROBOTIC ASSITED PARTIAL NEPHRECTOMY (Left)  Patient Location: PACU  Anesthesia Type:General  Level of Consciousness: sedated and Patient remains intubated per anesthesia plan  Airway & Oxygen Therapy: Patient Spontanous Breathing and Patient connected to T-piece oxygen  Post-op Assessment: Report given to PACU RN and Post -op Vital signs reviewed and stable  Post vital signs: Reviewed and stable  Complications: No apparent anesthesia complications

## 2012-06-11 NOTE — Anesthesia Preprocedure Evaluation (Signed)
Anesthesia Evaluation  Patient identified by MRN, date of birth, ID band Patient awake    Reviewed: Allergy & Precautions, H&P , NPO status , Patient's Chart, lab work & pertinent test results  Airway Mallampati: III TM Distance: <3 FB Neck ROM: Full    Dental No notable dental hx.    Pulmonary neg pulmonary ROS,  breath sounds clear to auscultation  Pulmonary exam normal       Cardiovascular hypertension, Pt. on medications Rhythm:Regular Rate:Normal     Neuro/Psych negative neurological ROS  negative psych ROS   GI/Hepatic Neg liver ROS, GERD-  ,  Endo/Other  diabetes, Insulin Dependent  Renal/GU negative Renal ROS  negative genitourinary   Musculoskeletal negative musculoskeletal ROS (+)   Abdominal   Peds negative pediatric ROS (+)  Hematology negative hematology ROS (+)   Anesthesia Other Findings   Reproductive/Obstetrics negative OB ROS                           Anesthesia Physical Anesthesia Plan  ASA: III  Anesthesia Plan: General   Post-op Pain Management:    Induction: Intravenous  Airway Management Planned: Oral ETT  Additional Equipment: Arterial line  Intra-op Plan:   Post-operative Plan:   Informed Consent: I have reviewed the patients History and Physical, chart, labs and discussed the procedure including the risks, benefits and alternatives for the proposed anesthesia with the patient or authorized representative who has indicated his/her understanding and acceptance.   Dental advisory given  Plan Discussed with: CRNA and Surgeon  Anesthesia Plan Comments:         Anesthesia Quick Evaluation

## 2012-06-11 NOTE — Anesthesia Postprocedure Evaluation (Signed)
Anesthesia Post Note  Patient: Jim [name]  Procedure(s) Performed: Procedure(s) (LRB): ROBOTIC ASSISTED LAPAROSCOPIC RADICAL PROSTATECTOMY (N/A) LYMPHADENECTOMY (Bilateral) ROBOTIC ASSITED PARTIAL NEPHRECTOMY (Left)  Anesthesia type: General  Patient location: PACU  Post pain: Pain level controlled  Post assessment: Post-op Vital signs reviewed  Last Vitals: BP 145/67  Pulse 101  Temp 36.7 C (Oral)  Resp 14  SpO2 96%  Post vital signs: Reviewed  Level of consciousness: sedated  Complications: No apparent anesthesia complications

## 2012-06-11 NOTE — H&P (Signed)
Jim [name] is an 62 y.o. male.   Chief Complaint: Pre-OP Robotic Prostatectomy / Lymphadenectomy + Left Renal Cyst Decortication  HPI:   1 - High Risk Prostate Cancer - No FHX prostate cancer. No prior screening. 02/2012 - DRE normal / PSA - 40.23  --> Mix of Gleason 3+4 and 4+3=7 in 10 of 12 cores up to 90% of cores. 03/2012 Bone Scan, CT abd/pelvis, Plain Films --> No radiographic metastasis  2 - Left Renal Cyst - Pt with large left renal cyst and some chronic left sided abd pain that he attributes to this. CT Urogram images reveal 10cm left lower pole bosniak 2 cyst, few periperhal calcifications but no frank nodules. Significant mass effect.   PMH sig for DM2, HTN, HLD. No CV disease. No blood thinners. No prior surgery.   Today [name] is seen to proceed with combined robotic left renal cyst decortication and prostatectomy + node dissection for high-risk prostate cancer.  Past Medical History  Diagnosis Date  . Hypertension   . Diabetes mellitus   . Chronic kidney disease     cyst on left kidney   . GERD (gastroesophageal reflux disease)   . Degenerative disc disease, lumbar     History reviewed. No pertinent past surgical history.  History reviewed. No pertinent family history. Social History:  reports that he has never smoked. He does not have any smokeless tobacco history on file. He reports that he does not drink alcohol or use illicit drugs.  Allergies: No Known Allergies  Medications Prior to Admission  Medication Sig Dispense Refill  . amLODipine (NORVASC) 10 MG tablet Take 10 mg by mouth every morning.      Marland Kitchen glipiZIDE (GLUCOTROL XL) 10 MG 24 hr tablet Take 10 mg by mouth every morning.      . insulin NPH (HUMULIN N,NOVOLIN N) 100 UNIT/ML injection Inject 10 Units into the skin at bedtime.      Marland Kitchen losartan-hydrochlorothiazide (HYZAAR) 50-12.5 MG per tablet Take 1 tablet by mouth every morning.      . metFORMIN (GLUCOPHAGE-XR) 500 MG 24 hr tablet Take 500 mg by  mouth 2 (two) times daily.      . simvastatin (ZOCOR) 20 MG tablet Take 20 mg by mouth at bedtime.        Results for orders placed during the hospital encounter of 06/11/12 (from the past 48 hour(s))  GLUCOSE, CAPILLARY     Status: Abnormal   Collection Time   06/11/12  5:36 AM      Component Value Range Comment   Glucose-Capillary 125 (*) 70 - 99 mg/dL    Comment 1 Documented in Chart      No results found.  Review of Systems  Constitutional: Negative.  Negative for fever and chills.  HENT: Negative.   Eyes: Negative.   Respiratory: Negative.   Cardiovascular: Negative.   Gastrointestinal: Negative.   Genitourinary: Negative.  Negative for hematuria.  Musculoskeletal: Negative.   Skin: Negative.   Neurological: Negative.   Endo/Heme/Allergies: Negative.   Psychiatric/Behavioral: Negative.     Blood pressure 173/85, pulse 95, temperature 98.5 F (36.9 C), temperature source Oral, resp. rate 18, SpO2 100.00%. Physical Exam  Constitutional: He is oriented to person, place, and time. He appears well-developed and well-nourished.  HENT:  Head: Normocephalic and atraumatic.  Eyes: EOM are normal. Pupils are equal, round, and reactive to light.  Neck: Normal range of motion. Neck supple.  Cardiovascular: Normal rate and regular rhythm.   Respiratory: Effort normal  and breath sounds normal.  GI: Soft. Bowel sounds are normal.       No hernias  Genitourinary: Penis normal.       No CVAT  Musculoskeletal: Normal range of motion.  Neurological: He is alert and oriented to person, place, and time.  Skin: Skin is warm and dry.  Psychiatric: He has a normal mood and affect. His behavior is normal. Judgment and thought content normal.     Assessment/Plan  1 - High-Risk Prostate Cancer - We again explained that he would likely need multimodal therapy regardless of choice of primary treatment.  We re-discussed prostatectomy and specifically robotic prostatectomy with bilateral  pelvic lymphadenectomy being the technique that I most commonly perform. I showed the patient on their abdomen the approximately 6 small incision (trocar) sites as well as presumed extraction sites with robotic approach as well as possible open incision sites should open conversion be necessary. We discussed peri-operative risks including bleeding, infection, deep vein thrombosis, pulmonary embolism, compartment syndrome, nuropathy / neuropraxia, heart attack, stroke, death, as well as long-term risks such as non-cure / need for additional therapy. We specifically addressed that the procedure would compromise urinary control leading to stress incontinence which typically resolves with time and pelvic rehabilitation (Kegel's, etc..), but can sometimes be permanent and require additional therapy including surgery. We also specifically addressed sexual sequellae including significant erectile dysfunction which typically partially resolves with time but can also be permanent and require additional therapy including surgery.   We discussed the typical hospital course including usual 1-2 night hospitalization, discharge with foley catheter in place usually for 1-2 weeks before voiding trial as well as usually 2 week recovery until able to perform most non-strenuous activity and 6 weeks until able to return to most jobs and more strenuous activity such as exercise.    2 - Left Renal Cyst -  We re-discussed the role of cyst decortication with the overall goals being a balance of trying to achieve complete surgical excision (negative margins) while minimizing loss of normally functioning kidney. We then discussed surgical approaches including robotic and open techniques with robotic associated with a shorter convalescence. I showed the patient on their abdomen the approximately 4-6 incision (trocar) sites as well as presumed extraction sites with robotic approach as well as possible open incision sites. We specifically  addressed that there may be need to alter operative plans according to intraopertive findings including conversion to open procedure or conversion to radical nephrectomy as well as need for adjunctive procedures such as ureteral stenting to promote correct renal healing. We discussed specific peri-operative risks including bleeding, infection, deep vein thrombosis, pulmonary embolism, compartment syndrome, neuropathy / neuropraxia, heart attack, stroke, death, as well as long-term risks such as non-cure / need for additional therapy and need for imaging and lab based post-op surveillance protocols.   Proceed with combined robotic left cyst decortication + robotic prostatectomy. I re-explained that we would first proceed with kidney surgery, and proceed with prostate surgery in same session if no problems arise.   Dailynn Nancarrow 06/11/2012, 6:07 AM

## 2012-06-12 LAB — BASIC METABOLIC PANEL
CO2: 28 mEq/L (ref 19–32)
Calcium: 8.8 mg/dL (ref 8.4–10.5)
Creatinine, Ser: 1.21 mg/dL (ref 0.50–1.35)

## 2012-06-12 LAB — GLUCOSE, CAPILLARY
Glucose-Capillary: 151 mg/dL — ABNORMAL HIGH (ref 70–99)
Glucose-Capillary: 147 mg/dL — ABNORMAL HIGH (ref 70–99)

## 2012-06-12 LAB — HEMOGLOBIN AND HEMATOCRIT, BLOOD: Hemoglobin: 9.9 g/dL — ABNORMAL LOW (ref 13.0–17.0)

## 2012-06-12 NOTE — Progress Notes (Signed)
1 Day Post-Op  Subjective:  1 - Large Left Renal Cyst, High-Risk Prostate Cancer - POD 1 s/p robotic left renal cyst decortication and prostatectomy / node dissection.  Pain controlled. Ambulatory. No nausea / vomiting. JP output minimal. AM labs pending.   Objective: Vital signs in last 24 hours: Temp:  [96.2 F (35.7 C)-99 F (37.2 C)] 98.6 F (37 C) (01/18 0625) Pulse Rate:  [74-112] 99  (01/18 0625) Resp:  [11-27] 19  (01/18 0625) BP: (145-212)/(63-94) 151/74 mmHg (01/18 0625) SpO2:  [93 %-100 %] 98 % (01/18 0625) Arterial Line BP: (160-215)/(63-95) 160/67 mmHg (01/17 1515) FiO2 (%):  [60 %] 60 % (01/17 1500) Weight:  [98.4 kg (216 lb 14.9 oz)] 98.4 kg (216 lb 14.9 oz) (01/17 1755)    Intake/Output from previous day: 01/17 0701 - 01/18 0700 In: 4100 [I.V.:3100; IV Piggyback:1000] Out: 1115 [Urine:625; Drains:90; Blood:400] Intake/Output this shift: Total I/O In: -  Out: 400 [Urine:400]  General appearance: alert, cooperative, appears stated age and family at bedside Head: Normocephalic, without obvious abnormality, atraumatic Eyes: conjunctivae/corneas clear. PERRL, EOM's intact. Fundi benign. Ears: normal TM's and external ear canals both ears Nose: Nares normal. Septum midline. Mucosa normal. No drainage or sinus tenderness. Throat: lips, mucosa, and tongue normal; teeth and gums normal Neck: no adenopathy, no carotid bruit, no JVD, supple, symmetrical, trachea midline and thyroid not enlarged, symmetric, no tenderness/mass/nodules Back: symmetric, no curvature. ROM normal. No CVA tenderness. Resp: clear to auscultation bilaterally Chest wall: no tenderness Cardio: regular rate and rhythm, S1, S2 normal, no murmur, click, rub or gallop GI: soft, non-tender; bowel sounds normal; no masses,  no organomegaly Male genitalia: normal, foley c/d/i with pink urine, no clots Extremities: extremities normal, atraumatic, no cyanosis or edema Pulses: 2+ and symmetric Skin:  Skin color, texture, turgor normal. No rashes or lesions Lymph nodes: Cervical, supraclavicular, and axillary nodes normal. Neurologic: Grossly normal Incision/Wound: JP with minimal serous output. All incision sites c/d/i. No hernias.  Lab Results:   Basename 06/11/12 1447  WBC --  HGB 11.1*  HCT 32.9*  PLT --   BMET  Basename 06/11/12 1447  NA 137  K 3.2*  CL 100  CO2 26  GLUCOSE 221*  BUN 16  CREATININE 1.23  CALCIUM 8.9   PT/INR No results found for this basename: LABPROT:2,INR:2 in the last 72 hours ABG No results found for this basename: PHART:2,PCO2:2,PO2:2,HCO3:2 in the last 72 hours  Studies/Results: No results found.  Anti-infectives: Anti-infectives     Start     Dose/Rate Route Frequency Ordered Stop   06/11/12 0510   ceFAZolin (ANCEF) IVPB 2 g/50 mL premix        2 g 100 mL/hr over 30 Minutes Intravenous 30 min pre-op 06/11/12 0510 06/11/12 0739          Assessment/Plan:  1 - Large Left Renal Cyst, High-Risk Prostate Cancer - Doing very well POD 1. Continue ambulation and advancing diet.  Likely DC tomorrow AM. JP to remain for now.  [facility], Gean Larose 06/12/2012

## 2012-06-12 NOTE — Op Note (Signed)
Jim [name], Jim [name] NO.:  [id number]  MEDICAL RECORD NO.:  [id number]  LOCATION:  1419                         FACILITY:  [facility]  PHYSICIAN:  Sebastian Ache, MD     DATE OF BIRTH:  [birth year]  DATE OF PROCEDURE:  06/11/2012 DATE OF DISCHARGE:                              OPERATIVE REPORT   PREOPERATIVE DIAGNOSES: 1. High risk prostate cancer. 2. Large left renal cyst and small left renal cyst.  PROCEDURE: 1. Robotic-assisted laparoscopic left renal cyst decortication. 2. Robotic-assisted laparoscopic prostatectomy with bilateral pelvic     lymphadenectomy.  ASSISTANT:  Pecola Leisure, PA  COMPLICATIONS:  None.  SPECIMENS: 1. Left lower pole large cyst wall. 2. Left superior cyst wall. 3. Radical prostatectomy. 4. Right external iliac lymph nodes. 5. Right external iliac lymph nodes sentinel. 6. Right obturator lymph nodes. 7. Left obturator lymph node. 8. Left external iliac lymph nodes.  DRAINS: 1. Jackson-Pratt drain bulb suction. 2. Foley catheter straight drain.  ESTIMATED BLOOD LOSS:  200 mL.  CYST FLUID VOLUME:  500 mL.  INDICATIONS:  Jim [name] is a pleasant 62 year old gentleman who was referred for second opinion and a large left renal cyst.  He has not had previous PSA screening, annual prostate screening, was found to have a very high PSA of 40.  He underwent evaluation including axial imaging, bone scan, prostate biopsy, and he was found to have localized high risk prostate cancer in addition to a very large symptomatic left renal cyst. Options were discussed for management including left cyst decortication as well as surgery with prostatectomy versus ablative therapy, further modalities for his prostate, he wished to proceed with a combined left cyst decortication and prostatectomy.  Informed consent obtained, placed in medical record.  PROCEDURE IN DETAIL:  The patient being [facility] and the procedure being robotic  left cyst decortication and prostatectomy was confirmed.  Procedure was carried out.  Time-out was performed. Intravenous antibiotics were administered.  General endotracheal anesthesia was introduced.  The patient was placed into a left side up with full flank position applying 15 degrees stable flexion, superior arm elevator, axillary roll, bean bag, sequential compression devices. He was further fashioned in the operating table using 3-inch tape over padding.  High-flow low pressure pneumoperitoneum was easily obtained using Veress technique in the left lower quadrant having passed the aspiration and drop test.  Next, a 12-mm robotic camera port was placed approximately 2 fingerbreadths lateral to the midline, 4 cm superior to the umbilicus.  Laparoscopic examination of the peritoneal cavity revealed no significant adhesions or visceral injury.  Additional ports were placed as follows, a left subcostal 8-mm robotic port, a left far lateral 8-mm robotic port and, the left inferior paramedian 8-mm robotic port as well as a 12-mm assist port just superior to the umbilicus.  Robot was docked and passed through the electronic checks. Initial attention was directed to the development of the retroperitoneum.  Incision made lateral to the descending colon from the area of the splenic flexure towards the area of the internal ring.  It was very carefully mobilized medially.  The mesentery of the colon was very adherent to the medial edge of the left lower  pole cyst which was very carefully dissected away from the anterior wall of the cyst.Posterior and inferior attachments to the cyst were also carefully taken down. Given the very large size of the cyst, decision made to proceed with identification of the ureter was identified as it coursed medially over the psoas muscle.  This was carefully retracted medially away from the medial edge of the cyst and dissected free from this.  The cyst was  then entered and 500 mL of straw colored fluid was evacuated.  The cyst was then very carefully excised, flushed normal-appearing parenchyma, and the edges of which were cauterized.  Indigo carmine was given.  No blue- colored urine extravasation was noted from the bed of the cyst.  A separate small cyst was noted at the superior aspect of the anterior kidney and this was also decorticated similarly with the cyst wall set aside for permanent pathology.  The much larger lower pole cyst wall was put into EndoCatch bag for later retrieval.  The robot was then undocked.  The port sites were covered with a Tegaderm, and the patient was completely repositioned into a low lithotomy position with his arms tucked.  This further fashioned in the operating table using large tape over his chest which was padded.  A test of steep Trendelenburg position was performed and he was found to be suitably positioned.  The previous 12-mm assistant port site now became robotic camera port site and the 12- mm port was introduced and pneumoperitoneum was re-established. Additional ports were then placed including a right far lateral 12-mm assistant port, a right paramedian 8-mm robotic port, and the left far lateral 8-mm robotic port for the prostatectomy portion of procedure. Robot was redocked and again passed through the electronic checks. Initial attention was directed to the development of the space of Retzius.  Incision was made lateral to the left medial umbilical ligament from the midline towards the area of the internal ring. Following the curvature of the iliac vessels, the bladder was dissected free from the sidewall towards the area of endopelvic fascia which was carefully swept laterally to the prostate all the way to the prostatic apex.  An mirror-image dissection was performed on the contralateral side.  The anterior attachments of the urinary bladder were taken down using robotic scissors taking  great care to avoid bladder injury which did not occur.  This exposed the anterior base of the prostate.  An 18- gauge needle was introduced through the supraumbilical midline and 0.4 mL of indocyanine green were injected into each lobe of the prostate under direct robotic vision with aspiration in between to avoid dye spillage.  This performed for later guidance and lymphadenectomy.  The dorsal venous complex was then controlled using endovascular stapler. It appeared that this did not provide adequate coverage as such.  An additional 0 Vicryl stitch was placed distally to this making sure not to include the membranous urethra.  The anterior surface was released from the dorsal venous complex with excellent hemostasis.  Next, the area of the bladder neck was identified and the Foley catheter back and forth and bladder neck dissection was performed by entering the anterior- posterior direction, dissecting circumferentially the bladder neck away from the base of the prostate.  Taken great care to avoid excessive caliber bladder neck which did not occur.  Ureteral orifices were well away from our plane of dissection.  Posterior dissection was performed by entering approximately 7 mm inferior to the posterior lip of the bladder  neck and dissecting directly posteriorly.  Bilateral seminal Vas deferens were encountered, dissected for a distance of 4 cm, placed on superior traction.  Bilateral seminal vesicles were similarly dissected into their tips, placed on superior traction.  Completion of the posterior dissection was performed and the base of apex orientation in the plane of [place] taking great care to avoid injury which did not occur.  This exposed the vascular pedicles bilaterally.  First, left pedicle was controlled using sequential clipping technique and base of apex orientation.  This area was incredibly firm and very dense, worrisome for possibly locally advanced disease,  however, dissection was performed as wide as possible.  Mirror image dissection was performed on the right side controlling the right neurovascular pedicle.  This exposed the membranous urethra which was carefully incised leaving what appeared to be an adequate urethral stump.  This completed the prostatectomy.  The specimen was placed into an EndoCatch bag for later retrieval.  Attention was then directed to the lymphadenectomy.  The area of the pelvis was inspected under near infrared light and several areas of sentinel lymph nodes were seen in the right external iliac packet as such all fibrofatty tissue confines the right external iliac vein artery and pelvic sidewall were carefully mobilized.  Lymphostasis was achieved using Hem-o-Lok clips.  This were set aside, labeled right external iliac lymph nodes.  There were 2 focal sentinel nodes that were set aside to place the right external iliac packet.  Right obturator lymph nodes were also dissected, dissecting all fiber fatty tissue and confines the obturator nerve pelvic side wall and iliac vein setting aside for permanent pathology.  The obturator nerve was inspected following dissection, was uninjured.  Mirror image lymphangiectomy performed on the contralateral side.  Again, dissecting the left external iliac and left obturator lymph node ensuring no injury to the left obturator nerve.  There were no obvious sentinel node to the left side.  Attention was then directed to the bladder to the urethra anastomosis and reconstruction.  Posterior reconstruction was performed using a Vicryl suture at the level of the posterior urethral plate and posterior bladder neck, placing these in a tension-free apposition.  Next, a double-armed V-Loc suture was used to perform mucosal anastomosis in a running fashion from 6 o'clock to 12 o'clock position.  Taking great care to avoid inclusion of the Foley catheter and the catheter was easily placed,  irrigated quantitatively.  Anterior reconstruction was performed by anchoring the anterior aspect of the anastomosis to the puboprostatic ligaments.  All sponge and needle counts were correct. Hemostasis appeared excellent.  Robot was then undocked.  A closed suction drain was brought through the previous left lateral robotic port site.  All 12-mm port sites were closed with the level of the fascia except for the camera port site which was extended for a total distance approximately 4 cm, and the EndoCatch bag containing the prostatectomy specimen was set aside for pathology as was the EndoCatch bag containing the large cyst wall specimen.  The retrieval site was closed with fascia using interrupted PDS.  All skin incisions were infiltrated with dilute lyophilized Marcaine and closed the level of skin using subcuticular Monocryl followed by Dermabond.  The procedure was then terminated.  The patient tolerated the procedure well.  There were no immediate periprocedural complications.  The patient was taken to postanesthesia care unit in stable condition.          ______________________________ Sebastian Ache, MD     TM/MEDQ  D:  06/11/2012  T:  06/12/2012  Job:  [id number]

## 2012-06-13 MED ORDER — CEPHALEXIN 250 MG PO CAPS: 250.0000 mg | ORAL_CAPSULE | Freq: Two times a day (BID) | ORAL | Status: DC

## 2012-06-13 MED ORDER — OXYCODONE-ACETAMINOPHEN 5-325 MG PO TABS: 1.0000 | ORAL_TABLET | ORAL | Status: DC | PRN

## 2012-06-13 MED ORDER — SENNA-DOCUSATE SODIUM 8.6-50 MG PO TABS: 1.0000 | ORAL_TABLET | Freq: Two times a day (BID) | ORAL | Status: DC

## 2012-06-13 NOTE — Discharge Summary (Signed)
Physician Discharge Summary  Patient ID: Colum Colt MRN: [id number] DOB/AGE: 1951-05-14 62 y.o.  Admit date: 06/11/2012 Discharge date: 06/13/2012  Admission Diagnoses: High Risk Prostate Cancer, Large left Renal Cyst  Discharge Diagnoses: High Risk Prostate Cancer, Large left Renal Cyst   Discharged Condition: good  Hospital Course:   Pt underwent robotic left renal cyst decortication and prostatectomy / pelvic lymphadenectomy on 1/17, the day of admission, without acute complications. He was admitted to the 4E Urology service post-op where he began his recovery. By POD 2, the day or discharge, his Hgb was stable, ambulatory, tolerating regular diet and pain controlled with PO meds. JP output was minimal and removed. Path pending at time of discharge.   Consults: None  Significant Diagnostic Studies: labs: Hgb >9. Pathology - pending.  Treatments: surgery:  robotic left renal cyst decortication and prostatectomy / pelvic lymphadenectomy on 1/17,  Discharge Exam: Blood pressure 136/65, pulse 99, temperature 99.5 F (37.5 C), temperature source Oral, resp. rate 19, height 5\' 8"  (1.727 m), weight 98.4 kg (216 lb 14.9 oz), SpO2 94.00%. General appearance: alert, cooperative and appears stated age Head: Normocephalic, without obvious abnormality, atraumatic Eyes: conjunctivae/corneas clear. PERRL, EOM's intact. Fundi benign. Ears: normal TM's and external ear canals both ears Nose: Nares normal. Septum midline. Mucosa normal. No drainage or sinus tenderness. Throat: lips, mucosa, and tongue normal; teeth and gums normal Neck: no adenopathy, no carotid bruit, no JVD, supple, symmetrical, trachea midline and thyroid not enlarged, symmetric, no tenderness/mass/nodules Back: symmetric, no curvature. ROM normal. No CVA tenderness. Resp: clear to auscultation bilaterally Chest wall: no tenderness Cardio: regular rate and rhythm, S1, S2 normal, no murmur, click, rub or gallop GI: soft,  non-tender; bowel sounds normal; no masses,  no organomegaly Male genitalia: normal, Foley c/d/i with clear urine.  Extremities: extremities normal, atraumatic, no cyanosis or edema Pulses: 2+ and symmetric Skin: Skin color, texture, turgor normal. No rashes or lesions Lymph nodes: Cervical, supraclavicular, and axillary nodes normal. Neurologic: Grossly normal Incision/Wound: Recent port sites and extraction sites c/d/i. No hernias. JP with minimal serous fluid, removed and dry dressing applied.   Disposition: 01-Home or Self Care     Medication List     As of 06/13/2012  8:09 AM    TAKE these medications         amLODipine 10 MG tablet   Commonly known as: NORVASC   Take 10 mg by mouth every morning.      cephALEXin 250 MG capsule   Commonly known as: KEFLEX   Take 1 capsule (250 mg total) by mouth 2 (two) times daily. X 3 days. Begin day before next urology appointment.      glipiZIDE 10 MG 24 hr tablet   Commonly known as: GLUCOTROL XL   Take 10 mg by mouth every morning.      insulin NPH 100 UNIT/ML injection   Commonly known as: HUMULIN N,NOVOLIN N   Inject 10 Units into the skin at bedtime.      losartan-hydrochlorothiazide 50-12.5 MG per tablet   Commonly known as: HYZAAR   Take 1 tablet by mouth every morning.      metFORMIN 500 MG 24 hr tablet   Commonly known as: GLUCOPHAGE-XR   Take 500 mg by mouth 2 (two) times daily.      oxyCODONE-acetaminophen 5-325 MG per tablet   Commonly known as: PERCOCET/ROXICET   Take 1 tablet by mouth every 4 (four) hours as needed for pain.  sennosides-docusate sodium 8.6-50 MG tablet   Commonly known as: SENOKOT-S   Take 1 tablet by mouth 2 (two) times daily. While taking pain meds to prevent constipation      simvastatin 20 MG tablet   Commonly known as: ZOCOR   Take 20 mg by mouth at bedtime.           Follow-up Information    Follow up with Sebastian Ache, MD. (as scheduled for MD visit and catheter removal)     Contact information:   [address] [place] [address] [phone]          Signed: Sebastian Ache 06/13/2012, 8:09 AM

## 2012-06-14 ENCOUNTER — Encounter (HOSPITAL_COMMUNITY): Payer: Self-pay | Admitting: Urology

## 2012-06-14 LAB — GLUCOSE, CAPILLARY
Glucose-Capillary: 199 mg/dL — ABNORMAL HIGH (ref 70–99)
Glucose-Capillary: 218 mg/dL — ABNORMAL HIGH (ref 70–99)
Glucose-Capillary: 216 mg/dL — ABNORMAL HIGH (ref 70–99)

## 2012-11-17 ENCOUNTER — Encounter: Payer: Self-pay | Admitting: Radiation Oncology

## 2012-11-17 NOTE — Progress Notes (Signed)
GU Location of Tumor / Histology: prostate cancer - adenocarcinoma  If Prostate Cancer, Gleason Score (3 + 4=7) and PSA is (0.14 on 10/28/2012)  Patient presented 02/2012 with signs/symptoms of: lower urinary tract symptoms   Biopsies of prostate (if applicable) revealed: prostatic adenocarcinoma, gleason's grade 3+4=7 with focal tertiary pattern 5  Past/Anticipated interventions by urology, if any: robotic assisted laparoscopic radical prostatectomy 06/11/2012, not nerve sparing  Past/Anticipated interventions by medical oncology, if any: none  Weight changes, if any: no  Bowel/Bladder complaints, if any: male stress incontinence, using 1 pad per day moderately wet, steady improvement   Nausea/Vomiting, if any: no  Pain issues, if any:  Progressive left hip pain, bone scan/CT scan negative, Oxycodone prn, does not take every day, never gets 100% relief  SAFETY ISSUES:  Prior radiation? no  Pacemaker/ICD? no  Possible current pregnancy? na  Is the patient on methotrexate? no  Current Complaints / other details:  Married, disabled, worked at [employer], 2 sons, 2 daughters, 7 grandchildren IPSS score 11

## 2012-11-18 ENCOUNTER — Ambulatory Visit
Admission: RE | Admit: 2012-11-18 | Discharge: 2012-11-18 | Disposition: A | Discharge status: Home / Self Care | Payer: 59 | Source: Ambulatory Visit | Attending: Radiation Oncology | Admitting: Radiation Oncology

## 2012-11-18 ENCOUNTER — Encounter: Payer: Self-pay | Admitting: Radiation Oncology

## 2012-11-18 VITALS — BP 164/81 | HR 81 | Temp 98.0°F | Resp 20 | Ht 68.0 in | Wt 202.0 lb

## 2012-11-18 DIAGNOSIS — C61 Malignant neoplasm of prostate: Secondary | ICD-10-CM

## 2012-11-18 DIAGNOSIS — Z79899 Other long term (current) drug therapy: Secondary | ICD-10-CM | POA: Insufficient documentation

## 2012-11-18 DIAGNOSIS — M25559 Pain in unspecified hip: Secondary | ICD-10-CM | POA: Insufficient documentation

## 2012-11-18 DIAGNOSIS — M51379 Other intervertebral disc degeneration, lumbosacral region without mention of lumbar back pain or lower extremity pain: Secondary | ICD-10-CM | POA: Insufficient documentation

## 2012-11-18 DIAGNOSIS — M5137 Other intervertebral disc degeneration, lumbosacral region: Secondary | ICD-10-CM | POA: Insufficient documentation

## 2012-11-18 DIAGNOSIS — I10 Essential (primary) hypertension: Secondary | ICD-10-CM | POA: Insufficient documentation

## 2012-11-18 DIAGNOSIS — R32 Unspecified urinary incontinence: Secondary | ICD-10-CM | POA: Insufficient documentation

## 2012-11-18 DIAGNOSIS — Z9079 Acquired absence of other genital organ(s): Secondary | ICD-10-CM | POA: Insufficient documentation

## 2012-11-18 DIAGNOSIS — E119 Type 2 diabetes mellitus without complications: Secondary | ICD-10-CM | POA: Insufficient documentation

## 2012-11-18 DIAGNOSIS — Z794 Long term (current) use of insulin: Secondary | ICD-10-CM | POA: Insufficient documentation

## 2012-11-18 HISTORY — DX: Malignant neoplasm of prostate: C61

## 2012-11-18 NOTE — Progress Notes (Signed)
Please see the Nurse Progress Note in the MD Initial Consult Encounter for this patient. 

## 2012-11-18 NOTE — Progress Notes (Signed)
[facility] Radiation Oncology NEW PATIENT EVALUATION  Name: Jim [name] MRN: [id number]  Date:   11/18/2012           DOB: 1950-06-28  Status: outpatient   CC: Elease Hashimoto, MD  Sebastian Ache, MD    REFERRING PHYSICIAN: Sebastian Ache, MD   DIAGNOSIS: Pathologic stage pT3a N0 adenocarcinoma prostate   HISTORY OF PRESENT ILLNESS:  Omarii [name] is a 62 y.o. male who is seen today for the courtesy of Dr. Berneice Heinrich for consideration of postoperative radiation therapy in the management of his carcinoma prostate. Last fall he developed left lower abdominal discomfort and was seen in an emergency room in [place], [place]. According to the patient, he was found to have a cyst within the left kidney and he was told to make a referral to see a urologist in [place]. He saw Dr. Berneice Heinrich  Who also obtain a history of lower urinary tract symptoms. He was found have a PSA of 40.3 on 03/02/2012 . He underwent ultrasound-guided biopsies on 03/25/2012 and was found to have extensive Gleason 7 (3+4) involving 10 of 12 biopsies with the range of 30% to 90% core involvement. His staging workup included a CT scan of the abdomen and pelvis and bone scan which were without evidence for metastatic disease. Of note is that there was no activity or left hip where he has had chronic hip pain. He elected to undergo a radical prostatectomy. His surgery was on 11/09/2012. His was found have extensive Gleason 7 (3+4) involving 50-60% of the gland. There was focal tertiary pattern 5. Tumor was present on the prostatic capsule surface of the left posterior prostate at the base, and also right posterior aspect of the base in addition to the apex. All 6 lymph nodes were free of metastatic disease. Seminal vesicles were not involved. His first postoperative PSA on 07/28/2012 was 0.03. A followup PSA on 10/28/2012 0.14. His urinary control continues to improve. He wears one pad a day. No GI difficulties.  He does have erectile dysfunction which predated his surgery. He does have a history of chronic lower back/hip pain for which she is on disability. More recently, his hip pain is worsened and he has been referred to Dr. Ciro Backer for further evaluation. PREVIOUS RADIATION THERAPY: No   PAST MEDICAL HISTORY:  has a past medical history of Hypertension; Diabetes mellitus; Chronic kidney disease; GERD (gastroesophageal reflux disease); Degenerative disc disease, lumbar; and Prostate cancer (06/11/12).     PAST SURGICAL HISTORY:  Past Surgical History  Procedure Laterality Date  . Robot assisted laparoscopic radical prostatectomy  06/11/2012    Procedure: ROBOTIC ASSISTED LAPAROSCOPIC RADICAL PROSTATECTOMY;  Surgeon: Sebastian Ache, MD;  Location: [facility];  Service: Urology;  Laterality: N/A;  1st procedure: Robotic Left Renal Cyst Decortication   . Lymphadenectomy  06/11/2012    Procedure: LYMPHADENECTOMY;  Surgeon: Sebastian Ache, MD;  Location: [facility];  Service: Urology;  Laterality: Bilateral;  . Robotic assited partial nephrectomy  06/11/2012    Procedure: ROBOTIC ASSITED PARTIAL NEPHRECTOMY;  Surgeon: Sebastian Ache, MD;  Location: [facility];  Service: Urology;  Laterality: Left;  . Prostate biopsy  03/25/2012     FAMILY HISTORY: family history includes Cancer in his brother, father, and mother. his father died from lung cancer at age 41. His mother died from pancreatic cancer at age 37. A brother was diagnosed with prostate cancer in his early 70s.   SOCIAL HISTORY:  reports that he has never smoked. He does  not have any smokeless tobacco history on file. He reports that he does not drink alcohol or use illicit drugs. Married, 5 children. He is been on disability for low back pain secondary to degenerative disc disease. He worked in a Veterinary surgeon.   ALLERGIES: Review of patient's allergies indicates no known allergies.   MEDICATIONS:  Current Outpatient Prescriptions  Medication Sig  Dispense Refill  . amLODipine (NORVASC) 10 MG tablet Take 10 mg by mouth every morning.      Marland Kitchen glipiZIDE (GLUCOTROL XL) 10 MG 24 hr tablet Take 10 mg by mouth every morning.      . insulin NPH (HUMULIN N,NOVOLIN N) 100 UNIT/ML injection Inject 10 Units into the skin at bedtime.      Marland Kitchen losartan-hydrochlorothiazide (HYZAAR) 50-12.5 MG per tablet Take 1 tablet by mouth every morning.      . metFORMIN (GLUCOPHAGE-XR) 500 MG 24 hr tablet Take 500 mg by mouth 2 (two) times daily.      Marland Kitchen oxyCODONE-acetaminophen (ROXICET) 5-325 MG per tablet Take 1 tablet by mouth every 4 (four) hours as needed for pain.  40 tablet  0  . sennosides-docusate sodium (SENOKOT-S) 8.6-50 MG tablet Take 1 tablet by mouth 2 (two) times daily. While taking pain meds to prevent constipation  30 tablet  1  . simvastatin (ZOCOR) 20 MG tablet Take 20 mg by mouth at bedtime.       No current facility-administered medications for this encounter.     REVIEW OF SYSTEMS:  Pertinent items are noted in HPI.    PHYSICAL EXAM:  height is 5\' 8"  (1.727 m) and weight is 202 lb (91.627 kg). His oral temperature is 98 F (36.7 C). His blood pressure is 164/81 and his pulse is 81. His respiration is 20.   Alert and oriented 62 year old African American male appearing younger than his stated age. Head and neck examination: Grossly unremarkable. Nodes: Without palpable cervical or supraclavicular lymphadenopathy. Chest: Lungs clear. Back: Without spinal or CVA discomfort. Abdomen: Without masses organomegaly. Genitalia: Unremarkable to inspection. Rectal: The prostate bed is flat and is without masses or nodularity. Extremities: Without edema. Neurologic examination: Grossly nonfocal.   LABORATORY DATA:  Lab Results  Component Value Date   WBC 6.5 06/07/2012   HGB 9.9* 06/12/2012   HCT 29.3* 06/12/2012   MCV 77.2* 06/07/2012   PLT 223 06/07/2012   Lab Results  Component Value Date   NA 135 06/12/2012   K 4.3 06/12/2012   CL 100 06/12/2012    CO2 28 06/12/2012   No results found for this basename: ALT, AST, GGT, ALKPHOS, BILITOT   PSA from 10/28/2012  0.14   IMPRESSION: Pathologic stage T3a adenocarcinoma prostate. He now has a rise in his PSA. He is a candidate for post prostatectomy salvage radiation therapy. I spent the patient and his wife that the strongest predictor for having localized disease, which may be salvageable with radiation therapy, is a positive margin(s).  His has multiple positive margins. With respect to his left hip pain, I doubt that this is from metastatic disease with a current PSA of 0.14 and a negative preoperative bone scan.  We discussed the potential acute and late toxicities of radiation therapy. We also discussed the fact radiation therapy may delay further improvement of his urinary incontinence. We are approaching 6 months, and I think that it is time to begin radiation therapy. We also discussed having him treated with a comfortably full bladder to minimize urinary related toxicity. Consent  is signed today .   PLAN: As discussed above. I'll have him return for simulation/treatment planning the week of July 7 and begin his treatment in mid July which would be 6 months out from his surgery.  I spent 60 minutes minutes face to face with the patient and more than 50% of that time was spent in counseling and/or coordination of care.

## 2012-11-23 ENCOUNTER — Telehealth: Payer: Self-pay | Admitting: *Deleted

## 2012-11-23 NOTE — Telephone Encounter (Signed)
CALLED PATIENT TO INFORM OF SIM APPT. FOR 11-30-12 AT 2 PM, SPOKE WITH PATIENT AND HE IS AWARE OF THIS APPT.

## 2012-11-30 ENCOUNTER — Ambulatory Visit
Admission: RE | Admit: 2012-11-30 | Discharge: 2012-11-30 | Disposition: A | Discharge status: Home / Self Care | Payer: 59 | Source: Ambulatory Visit | Attending: Radiation Oncology | Admitting: Radiation Oncology

## 2012-11-30 DIAGNOSIS — I10 Essential (primary) hypertension: Secondary | ICD-10-CM | POA: Insufficient documentation

## 2012-11-30 DIAGNOSIS — R351 Nocturia: Secondary | ICD-10-CM | POA: Insufficient documentation

## 2012-11-30 DIAGNOSIS — C61 Malignant neoplasm of prostate: Secondary | ICD-10-CM

## 2012-11-30 DIAGNOSIS — Z510 Encounter for antineoplastic radiation therapy: Secondary | ICD-10-CM | POA: Insufficient documentation

## 2012-11-30 DIAGNOSIS — M25559 Pain in unspecified hip: Secondary | ICD-10-CM | POA: Insufficient documentation

## 2012-11-30 NOTE — Progress Notes (Signed)
Complex simulation/treatment planning note: The patient was taken to the CT simulator. A Vac lock immobilization device was constructed for immobilization. A red rubber catheter was placed within the rectal vault. He was then catheterized and contrast instilled into the bladder/urethra. His pelvis was scanned. I contoured CTV 66 representing the high-risk prostate bed and expanded this by 0.5 cm to create PTV 66 which will receive 6600 cGy in 33 sessions. I also contoured his bladder, rectum, and rectosigmoid colon. He is now ready for IMRT simulation/treatment planning. I requesting daily MV CT setting up to his anterior rectum each day. He is to be treated with a company full bladder.

## 2012-12-02 ENCOUNTER — Encounter: Payer: Self-pay | Admitting: Radiation Oncology

## 2012-12-02 NOTE — Progress Notes (Signed)
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 IMRT was chosen to decrease the risk for both acute and late bladder and rectal toxicity compared to conventional or 3-D conformal radiation therapy. Dose volume histograms were obtained for the target and avoidance structures. We met our departmental goals. I prescribing 6600 cGy to his prostate bed PTV in 30 sessions. I requested he undergo daily MV CT on Tomotherapy with a comfortably full bladder setting up to his anterior rectum.

## 2012-12-13 ENCOUNTER — Encounter: Payer: Self-pay | Admitting: Radiation Oncology

## 2012-12-13 ENCOUNTER — Ambulatory Visit
Admission: RE | Admit: 2012-12-13 | Discharge: 2012-12-13 | Disposition: A | Discharge status: Home / Self Care | Payer: 59 | Source: Ambulatory Visit | Attending: Radiation Oncology | Admitting: Radiation Oncology

## 2012-12-13 VITALS — BP 182/97 | HR 84 | Temp 97.6°F | Resp 20 | Wt 206.3 lb

## 2012-12-13 DIAGNOSIS — C61 Malignant neoplasm of prostate: Secondary | ICD-10-CM

## 2012-12-13 NOTE — Progress Notes (Signed)
IMRT Device Note- outpatient  5.9  delivered field widths represent one set of IMRT treatment devices. The code is [phone].  -----------------------------------  Lonie Peak, MD

## 2012-12-13 NOTE — Progress Notes (Signed)
Patient weekly rad txs, prostate 1st tx completed, book radiation theraPY AND YOU BOOK GIVEN, DISCUSSES S/E TO REPORT, DRINK PLENTY WATER BEFORE TX AND AFTER DAILY, WILL DO MORE EDUCATION TOMORROW, REG BOWELS, ONLY GETS UP 1-2X AT NIGHT TO VOID, NO HEMATURIA, NO URGENCY/FRE3QUENCY, HESITANCY

## 2012-12-13 NOTE — Progress Notes (Signed)
   Weekly Management Note:  outpatient Current Dose:  2 Gy  Projected Dose: 66 Gy   Narrative:  The patient presents for routine under treatment assessment.  CBCT/MVCT images/Port film x-rays were reviewed.  The chart was checked. No questions or concerns today  Physical Findings:  weight is 206 lb 4.8 oz (93.577 kg). His oral temperature is 97.6 F (36.4 C). His blood pressure is 182/97 and his pulse is 84. His respiration is 20.  NAD  Impression:  The patient is tolerating radiotherapy.  Plan:  Continue radiotherapy as planned.   ________________________________   Lonie Peak, M.D.

## 2012-12-14 ENCOUNTER — Ambulatory Visit
Admission: RE | Admit: 2012-12-14 | Discharge: 2012-12-14 | Disposition: A | Discharge status: Home / Self Care | Payer: 59 | Source: Ambulatory Visit | Attending: Radiation Oncology | Admitting: Radiation Oncology

## 2012-12-15 ENCOUNTER — Ambulatory Visit
Admission: RE | Admit: 2012-12-15 | Discharge: 2012-12-15 | Disposition: A | Discharge status: Home / Self Care | Payer: 59 | Source: Ambulatory Visit | Attending: Radiation Oncology | Admitting: Radiation Oncology

## 2012-12-15 ENCOUNTER — Encounter: Payer: Self-pay | Admitting: Radiation Oncology

## 2012-12-15 NOTE — Progress Notes (Signed)
Chart note: The patient underwent Tomotherapy segmentation on 12/13/2012 for treatment to his prostate bed. He is being treated to 5.9 delivered field widths corresponding to one set of IMRT treatment devices [phone].

## 2012-12-16 ENCOUNTER — Ambulatory Visit
Admission: RE | Admit: 2012-12-16 | Discharge: 2012-12-16 | Disposition: A | Discharge status: Home / Self Care | Payer: 59 | Source: Ambulatory Visit | Attending: Radiation Oncology | Admitting: Radiation Oncology

## 2012-12-17 ENCOUNTER — Ambulatory Visit
Admission: RE | Admit: 2012-12-17 | Discharge: 2012-12-17 | Disposition: A | Discharge status: Home / Self Care | Payer: 59 | Source: Ambulatory Visit | Attending: Radiation Oncology | Admitting: Radiation Oncology

## 2012-12-20 ENCOUNTER — Encounter: Payer: Self-pay | Admitting: Radiation Oncology

## 2012-12-20 ENCOUNTER — Ambulatory Visit
Admission: RE | Admit: 2012-12-20 | Discharge: 2012-12-20 | Disposition: A | Discharge status: Home / Self Care | Payer: 59 | Source: Ambulatory Visit | Attending: Radiation Oncology | Admitting: Radiation Oncology

## 2012-12-20 ENCOUNTER — Telehealth: Payer: Self-pay | Admitting: *Deleted

## 2012-12-20 VITALS — BP 178/84 | HR 85 | Temp 98.2°F | Resp 20 | Wt 203.1 lb

## 2012-12-20 DIAGNOSIS — C61 Malignant neoplasm of prostate: Secondary | ICD-10-CM

## 2012-12-20 NOTE — Progress Notes (Signed)
Weekly Management Note:  Site: Prostate bed Current Dose:  1200  cGy Projected Dose: 6600  cGy  Narrative: The patient is seen today for routine under treatment assessment. CBCT/MVCT images/port films were reviewed. The chart was reviewed.   Bladder filling is suboptimal. He does have mild dysuria is otherwise doing well. No GI difficulties.  Physical Examination:  Filed Vitals:   12/20/12 1048  BP: 178/84  Pulse: 85  Temp: 98.2 F (36.8 C)  Resp: 20  .  Weight: 203 lb 1.6 oz (92.126 kg). No change.  Impression: Tolerating radiation therapy well. I encouraged him to keep a comfortably full bladder. He tells me that he feels that his bladder is full. He'll try to make more of an effort.  Plan: Continue radiation therapy as planned.

## 2012-12-20 NOTE — Telephone Encounter (Signed)
Per patient request called Dr.William Selvidge office,patient's Primary MD in [place] spoke with receptionist,Maria,  Informed her to let Dr.Selvidge know that patient is receiving radiation treatments prostate  From 12/13/12-01/27/13,  She stated she would, gave my phone number [phone] for any questions 11:03 AM

## 2012-12-20 NOTE — Progress Notes (Signed)
Weekly rad txs prostate 7/ completed so far, slight dysuria at times, regular bowels, no hematuria, no frequency, or urgency, nocturia x2 no fatigue 10:50 AM

## 2012-12-21 ENCOUNTER — Ambulatory Visit
Admission: RE | Admit: 2012-12-21 | Discharge: 2012-12-21 | Disposition: A | Discharge status: Home / Self Care | Payer: 59 | Source: Ambulatory Visit | Attending: Radiation Oncology | Admitting: Radiation Oncology

## 2012-12-22 ENCOUNTER — Ambulatory Visit
Admission: RE | Admit: 2012-12-22 | Discharge: 2012-12-22 | Disposition: A | Discharge status: Home / Self Care | Payer: 59 | Source: Ambulatory Visit | Attending: Radiation Oncology | Admitting: Radiation Oncology

## 2012-12-23 ENCOUNTER — Ambulatory Visit
Admission: RE | Admit: 2012-12-23 | Discharge: 2012-12-23 | Disposition: A | Discharge status: Home / Self Care | Payer: 59 | Source: Ambulatory Visit | Attending: Radiation Oncology | Admitting: Radiation Oncology

## 2012-12-24 ENCOUNTER — Ambulatory Visit
Admission: RE | Admit: 2012-12-24 | Discharge: 2012-12-24 | Disposition: A | Discharge status: Home / Self Care | Payer: 59 | Source: Ambulatory Visit | Attending: Radiation Oncology | Admitting: Radiation Oncology

## 2012-12-27 ENCOUNTER — Ambulatory Visit
Admission: RE | Admit: 2012-12-27 | Discharge: 2012-12-27 | Disposition: A | Discharge status: Home / Self Care | Payer: 59 | Source: Ambulatory Visit | Attending: Radiation Oncology | Admitting: Radiation Oncology

## 2012-12-27 ENCOUNTER — Encounter: Payer: Self-pay | Admitting: Radiation Oncology

## 2012-12-27 VITALS — BP 216/91 | HR 78 | Temp 98.2°F | Resp 20 | Wt 204.0 lb

## 2012-12-27 DIAGNOSIS — C61 Malignant neoplasm of prostate: Secondary | ICD-10-CM

## 2012-12-27 NOTE — Progress Notes (Addendum)
Weekly rad txs, prostate, 11 tx so far, no dysuria, no hematuria, regular bowels, b/p up, takes norvasc in am, no c/o pain, appetite good, drinking plenty water, doesn't use table  salt 11:20 AM

## 2012-12-27 NOTE — Progress Notes (Signed)
Weekly Management Note:  Site: Prostate bed Current Dose:  2200  cGy Projected Dose: 6600  cGy  Narrative: The patient is seen today for routine under treatment assessment. CBCT/MVCT images/port films were reviewed. The chart was reviewed.   Bladder filling is again suboptimal. No GU or GI difficulties.  Physical Examination:  Filed Vitals:   12/27/12 1117  BP: 216/91  Pulse: 78  Temp: 98.2 F (36.8 C)  Resp: 20  .  Weight: 204 lb (92.534 kg). No change  Impression: Tolerating radiation therapy well. I again emphasized the need for him to have better bladder filling.  Plan: Continue radiation therapy as planned.

## 2012-12-28 ENCOUNTER — Ambulatory Visit
Admission: RE | Admit: 2012-12-28 | Discharge: 2012-12-28 | Disposition: A | Discharge status: Home / Self Care | Payer: 59 | Source: Ambulatory Visit | Attending: Radiation Oncology | Admitting: Radiation Oncology

## 2012-12-29 ENCOUNTER — Ambulatory Visit
Admission: RE | Admit: 2012-12-29 | Discharge: 2012-12-29 | Disposition: A | Discharge status: Home / Self Care | Payer: 59 | Source: Ambulatory Visit | Attending: Radiation Oncology | Admitting: Radiation Oncology

## 2012-12-30 ENCOUNTER — Ambulatory Visit
Admission: RE | Admit: 2012-12-30 | Discharge: 2012-12-30 | Disposition: A | Discharge status: Home / Self Care | Payer: 59 | Source: Ambulatory Visit | Attending: Radiation Oncology | Admitting: Radiation Oncology

## 2012-12-31 ENCOUNTER — Ambulatory Visit
Admission: RE | Admit: 2012-12-31 | Discharge: 2012-12-31 | Disposition: A | Discharge status: Home / Self Care | Payer: 59 | Source: Ambulatory Visit | Attending: Radiation Oncology | Admitting: Radiation Oncology

## 2013-01-03 ENCOUNTER — Ambulatory Visit
Admission: RE | Admit: 2013-01-03 | Discharge: 2013-01-03 | Disposition: A | Discharge status: Home / Self Care | Payer: 59 | Source: Ambulatory Visit | Attending: Radiation Oncology | Admitting: Radiation Oncology

## 2013-01-03 VITALS — BP 190/86 | HR 86 | Temp 97.3°F | Ht 68.0 in | Wt 205.0 lb

## 2013-01-03 DIAGNOSIS — C61 Malignant neoplasm of prostate: Secondary | ICD-10-CM

## 2013-01-03 NOTE — Progress Notes (Signed)
Jim [name] here for weekly under treat visit.  He has had 16 fractions to his prostate.  He denies pain, fatigue, dysuria, hematuria and diarrhea.  He states that he is getting up 3 times per night to urinate.  He also says that it is very hard to maintain a full bladder before treatment.

## 2013-01-03 NOTE — Progress Notes (Signed)
   Weekly Management Note:  outpatient Current Dose:  32 Gy  Projected Dose: 66 Gy   Narrative:  The patient presents for routine under treatment assessment.  CBCT/MVCT images/Port film x-rays were reviewed.  The chart was checked. He is doing relatively well.  He reports his nocturia is stable to mildly more frequent - 2-3/night. Bladder filling still a challenge.  Physical Findings:  height is 5\' 8"  (1.727 m) and weight is 205 lb (92.987 kg). His temperature is 97.3 F (36.3 C). His blood pressure is 190/86 and his pulse is 86.  NAD  Impression:  The patient is tolerating radiotherapy.  Plan:  Continue radiotherapy as planned.   ________________________________   Lonie Peak, M.D.

## 2013-01-04 ENCOUNTER — Ambulatory Visit
Admission: RE | Admit: 2013-01-04 | Discharge: 2013-01-04 | Disposition: A | Discharge status: Home / Self Care | Payer: 59 | Source: Ambulatory Visit | Attending: Radiation Oncology | Admitting: Radiation Oncology

## 2013-01-05 ENCOUNTER — Ambulatory Visit
Admission: RE | Admit: 2013-01-05 | Discharge: 2013-01-05 | Disposition: A | Discharge status: Home / Self Care | Payer: 59 | Source: Ambulatory Visit | Attending: Radiation Oncology | Admitting: Radiation Oncology

## 2013-01-06 ENCOUNTER — Ambulatory Visit
Admission: RE | Admit: 2013-01-06 | Discharge: 2013-01-06 | Disposition: A | Discharge status: Home / Self Care | Payer: 59 | Source: Ambulatory Visit | Attending: Radiation Oncology | Admitting: Radiation Oncology

## 2013-01-07 ENCOUNTER — Ambulatory Visit
Admission: RE | Admit: 2013-01-07 | Discharge: 2013-01-07 | Disposition: A | Discharge status: Home / Self Care | Payer: 59 | Source: Ambulatory Visit | Attending: Radiation Oncology | Admitting: Radiation Oncology

## 2013-01-10 ENCOUNTER — Ambulatory Visit
Admission: RE | Admit: 2013-01-10 | Discharge: 2013-01-10 | Disposition: A | Discharge status: Home / Self Care | Payer: 59 | Source: Ambulatory Visit | Attending: Radiation Oncology | Admitting: Radiation Oncology

## 2013-01-10 ENCOUNTER — Encounter: Payer: Self-pay | Admitting: Radiation Oncology

## 2013-01-10 VITALS — BP 171/79 | HR 82 | Resp 16 | Wt 202.1 lb

## 2013-01-10 DIAGNOSIS — C61 Malignant neoplasm of prostate: Secondary | ICD-10-CM

## 2013-01-10 NOTE — Progress Notes (Signed)
Weekly Management Note:  Site: Prostate bed Current Dose:  4200  cGy Projected Dose: 6600  cGy  Narrative: The patient is seen today for routine under treatment assessment. CBCT/MVCT images/port films were reviewed. The chart was reviewed.   Bladder filling is satisfactory. He now has nocturia x3 with a baseline of 2. He had some diarrhea last week but this has improved. He no longer takes Imodium.  Physical Examination:  Filed Vitals:   01/10/13 1126  BP: 171/79  Pulse: 82  Resp: 16  .  Weight: 202 lb 1.6 oz (91.672 kg). No change.  Impression: Tolerating radiation therapy well.  Plan: Continue radiation therapy as planned.

## 2013-01-10 NOTE — Progress Notes (Signed)
Reports diarrhea worse on Wednesday but, resolved Thursday. Confirmed its safe to take Imodium. Verbalized understanding. Reports mild nausea today. Questions what medication he should take to manage nausea. BP elevated. Reports he contacted his PCP and began taking his bp medication bid as directed by PCP. Denies burning with urination. Reports on average he gets up 3 times per night to void. Denies incontinence or difficulty emptying his bladder. Reports normal strong urine stream.

## 2013-01-11 ENCOUNTER — Ambulatory Visit
Admission: RE | Admit: 2013-01-11 | Discharge: 2013-01-11 | Disposition: A | Discharge status: Home / Self Care | Payer: 59 | Source: Ambulatory Visit | Attending: Radiation Oncology | Admitting: Radiation Oncology

## 2013-01-12 ENCOUNTER — Ambulatory Visit
Admission: RE | Admit: 2013-01-12 | Discharge: 2013-01-12 | Disposition: A | Discharge status: Home / Self Care | Payer: 59 | Source: Ambulatory Visit | Attending: Radiation Oncology | Admitting: Radiation Oncology

## 2013-01-13 ENCOUNTER — Ambulatory Visit
Admission: RE | Admit: 2013-01-13 | Discharge: 2013-01-13 | Disposition: A | Discharge status: Home / Self Care | Payer: 59 | Source: Ambulatory Visit | Attending: Radiation Oncology | Admitting: Radiation Oncology

## 2013-01-14 ENCOUNTER — Ambulatory Visit
Admission: RE | Admit: 2013-01-14 | Discharge: 2013-01-14 | Disposition: A | Discharge status: Home / Self Care | Payer: 59 | Source: Ambulatory Visit | Attending: Radiation Oncology | Admitting: Radiation Oncology

## 2013-01-17 ENCOUNTER — Ambulatory Visit
Admission: RE | Admit: 2013-01-17 | Discharge: 2013-01-17 | Disposition: A | Discharge status: Home / Self Care | Payer: 59 | Source: Ambulatory Visit | Attending: Radiation Oncology | Admitting: Radiation Oncology

## 2013-01-18 ENCOUNTER — Ambulatory Visit
Admission: RE | Admit: 2013-01-18 | Discharge: 2013-01-18 | Disposition: A | Discharge status: Home / Self Care | Payer: 59 | Source: Ambulatory Visit | Attending: Radiation Oncology | Admitting: Radiation Oncology

## 2013-01-18 ENCOUNTER — Encounter: Payer: Self-pay | Admitting: Radiation Oncology

## 2013-01-18 VITALS — BP 180/88 | HR 91 | Temp 98.3°F | Resp 20 | Wt 204.8 lb

## 2013-01-18 DIAGNOSIS — C61 Malignant neoplasm of prostate: Secondary | ICD-10-CM

## 2013-01-18 NOTE — Progress Notes (Signed)
Pt c/o dull pain in his left hip. He states he had MRI and it was normal, but he will seek a second opinion. Pt reports urinary frequency, urgency, nocturia x 3-4. He denies fatigue, bowel issues, loss of appetite.

## 2013-01-18 NOTE — Progress Notes (Signed)
Weekly Management Note:  Site: Prostate bed Current Dose:  5400  cGy Projected Dose: 6600  cGy  Narrative: The patient is seen today for routine under treatment assessment. CBCT/MVCT images/port films were reviewed. The chart was reviewed.   Bladder filling has been satisfactory. No significant GU or GI difficulties although he does have slight increase in urinary frequency.  Physical Examination:  Filed Vitals:   01/18/13 1032  BP: 180/88  Pulse: 91  Temp: 98.3 F (36.8 C)  Resp: 20  .  Weight: 204 lb 12.8 oz (92.897 kg). No change.  Impression: Tolerating radiation therapy well.  Plan: Continue radiation therapy as planned.

## 2013-01-19 ENCOUNTER — Ambulatory Visit
Admission: RE | Admit: 2013-01-19 | Discharge: 2013-01-19 | Disposition: A | Discharge status: Home / Self Care | Payer: 59 | Source: Ambulatory Visit | Attending: Radiation Oncology | Admitting: Radiation Oncology

## 2013-01-20 ENCOUNTER — Ambulatory Visit
Admission: RE | Admit: 2013-01-20 | Discharge: 2013-01-20 | Disposition: A | Discharge status: Home / Self Care | Payer: 59 | Source: Ambulatory Visit | Attending: Radiation Oncology | Admitting: Radiation Oncology

## 2013-01-21 ENCOUNTER — Ambulatory Visit
Admission: RE | Admit: 2013-01-21 | Discharge: 2013-01-21 | Disposition: A | Discharge status: Home / Self Care | Payer: 59 | Source: Ambulatory Visit | Attending: Radiation Oncology | Admitting: Radiation Oncology

## 2013-01-25 ENCOUNTER — Ambulatory Visit
Admission: RE | Admit: 2013-01-25 | Discharge: 2013-01-25 | Disposition: A | Discharge status: Home / Self Care | Payer: 59 | Source: Ambulatory Visit | Attending: Radiation Oncology | Admitting: Radiation Oncology

## 2013-01-25 VITALS — BP 195/89 | HR 90 | Temp 97.5°F | Ht 68.0 in | Wt 202.8 lb

## 2013-01-25 DIAGNOSIS — C61 Malignant neoplasm of prostate: Secondary | ICD-10-CM

## 2013-01-25 NOTE — Progress Notes (Signed)
Jim [name] here for weekly under treat visit.  He has had 30 fractions to his prostate.  He does have pain in his left hip that he is rating at a 3/10.  He said this pain had started before he started treatment and he had an MRI done that did not show anything.  He denies fatigue, dysuria and diarrhea.  His BP today was 195/89.  He said he needs to go see his PCP to have his blood pressure medications increased.  He said the only changes he has noticed with radiation is that he is getting up 3-4 times per night to urinate.

## 2013-01-25 NOTE — Progress Notes (Signed)
Weekly Management Note:  Site: Prostate bed Current Dose:  6200  cGy Projected Dose: 6600  cGy  Narrative: The patient is seen today for routine under treatment assessment. CBCT/MVCT images/port films were reviewed. The chart was reviewed.   Filling satisfactory. No change in his GU or GI habits. He does have nocturia x3-4. He also reports left hip pain which he had prior to initiation of his radiation therapy. He is going back to his primary care physician for management of his hypertension.  Physical Examination:  Filed Vitals:   01/25/13 1023  BP: 195/89  Pulse: 90  Temp: 97.5 F (36.4 C)  .  Weight: 202 lb 12.8 oz (91.989 kg). No change.  Impression: Tolerating radiation therapy well. He finishes his treatment this Thursday.  Plan: Continue radiation therapy as planned. One-month followup appointment after completion of radiation therapy.

## 2013-01-26 ENCOUNTER — Ambulatory Visit
Admission: RE | Admit: 2013-01-26 | Discharge: 2013-01-26 | Disposition: A | Discharge status: Home / Self Care | Payer: 59 | Source: Ambulatory Visit | Attending: Radiation Oncology | Admitting: Radiation Oncology

## 2013-01-27 ENCOUNTER — Encounter: Payer: Self-pay | Admitting: Radiation Oncology

## 2013-01-27 ENCOUNTER — Ambulatory Visit
Admission: RE | Admit: 2013-01-27 | Discharge: 2013-01-27 | Disposition: A | Discharge status: Home / Self Care | Payer: 59 | Source: Ambulatory Visit | Attending: Radiation Oncology | Admitting: Radiation Oncology

## 2013-01-27 NOTE — Progress Notes (Signed)
[facility] Radiation Oncology End of Treatment Note  Name:Jim [name]  Date: 01/27/2013 NWG:[id number] DOB:[birth year]   Status:outpatient    CC: Arlyss Queen, MD  Dr. Sebastian Ache  REFERRING PHYSICIAN:  Dr. Sebastian Ache   DIAGNOSIS: Pathologic stage  pT3 N0 adenocarcinoma prostate  INDICATION FOR TREATMENT: Curative   TREATMENT DATES: 12/13/2012 through 01/27/2013                          SITE/DOSE:    Prostate bed 6600 cGy 33 sessions                        BEAMS/ENERGY:    6 MV photons helical IMRT Tomotherapy               NARRATIVE:  The patient tolerated his treatment well with the expected degree of radiation urethritis by completion of therapy. He did not require any medication.                          PLAN: Routine followup in one month. Patient instructed to call if questions or worsening complaints in interim.

## 2013-02-09 ENCOUNTER — Encounter: Payer: Self-pay | Admitting: Radiation Oncology

## 2013-02-09 NOTE — Progress Notes (Signed)
[employer] form signed by Dr. Dayton Scrape, mailed form to [employer] form on 02/07/13

## 2013-03-02 ENCOUNTER — Encounter: Payer: Self-pay | Admitting: *Deleted

## 2013-03-02 DIAGNOSIS — Z923 Personal history of irradiation: Secondary | ICD-10-CM | POA: Insufficient documentation

## 2013-03-08 ENCOUNTER — Ambulatory Visit
Admission: RE | Admit: 2013-03-08 | Discharge: 2013-03-08 | Disposition: A | Discharge status: Home / Self Care | Payer: Medicare Other | Source: Ambulatory Visit | Attending: Radiation Oncology | Admitting: Radiation Oncology

## 2013-03-08 ENCOUNTER — Encounter: Payer: Self-pay | Admitting: Radiation Oncology

## 2013-03-08 VITALS — BP 175/77 | HR 91 | Temp 98.2°F | Resp 20 | Wt 198.5 lb

## 2013-03-08 DIAGNOSIS — C61 Malignant neoplasm of prostate: Secondary | ICD-10-CM

## 2013-03-08 NOTE — Progress Notes (Signed)
Pt c/o pain in his left hip which he states has bothered him off and on for several months. He states he had MRI which was normal. He takes Aleve prn.  Pt denies fatigue, loss of appetite, bowel issues, dysuria. He has nocturia x 1-2.

## 2013-03-08 NOTE — Progress Notes (Signed)
CC: Dr. Sebastian Ache  Followup note:  Mr. Peace returns today approximately 6 weeks following completion of radiation therapy in the management of his pathologic stage T3 N0 adenocarcinoma prostate. He is doing well from a GU and GI standpoint. He does have nocturia x2-3. He no longer has dysuria. He still has occasional constipation. His major complaint is that of left hip pain, and he was apparently worked up with a MRI scan from [facility]. He was told they did not find anything.  Physical examination: Alert and oriented. Filed Vitals:   03/08/13 1051  BP: 175/77  Pulse: 91  Temp: 98.2 F (36.8 C)  Resp: 20   Rectal examination not performed today  Impression: Satisfactory progress from a GU and GI standpoint. He is seeking a second orthopedic opinion.  Plan: Followup visit with Dr. Berneice Heinrich on December 9. I've not scheduled the patient for a formal followup visit and I ask that Dr. Berneice Heinrich keep me posted on his progress.

## 2014-08-07 DIAGNOSIS — N281 Cyst of kidney, acquired: Secondary | ICD-10-CM | POA: Diagnosis not present

## 2014-08-07 DIAGNOSIS — N189 Chronic kidney disease, unspecified: Secondary | ICD-10-CM | POA: Diagnosis not present

## 2014-08-07 DIAGNOSIS — C61 Malignant neoplasm of prostate: Secondary | ICD-10-CM | POA: Diagnosis not present

## 2014-08-07 DIAGNOSIS — N393 Stress incontinence (female) (male): Secondary | ICD-10-CM | POA: Diagnosis not present

## 2014-08-07 DIAGNOSIS — N5201 Erectile dysfunction due to arterial insufficiency: Secondary | ICD-10-CM | POA: Diagnosis not present

## 2014-09-06 ENCOUNTER — Emergency Department (HOSPITAL_COMMUNITY): Payer: BLUE CROSS/BLUE SHIELD

## 2014-09-06 ENCOUNTER — Observation Stay (HOSPITAL_COMMUNITY): Payer: BLUE CROSS/BLUE SHIELD

## 2014-09-06 ENCOUNTER — Inpatient Hospital Stay (HOSPITAL_COMMUNITY)
Admission: EM | Admit: 2014-09-06 | Discharge: 2014-09-08 | DRG: 065 | Disposition: A | Discharge status: Home / Self Care | Payer: BLUE CROSS/BLUE SHIELD | Attending: Internal Medicine | Admitting: Internal Medicine

## 2014-09-06 ENCOUNTER — Encounter (HOSPITAL_COMMUNITY): Payer: Self-pay

## 2014-09-06 DIAGNOSIS — Z9119 Patient's noncompliance with other medical treatment and regimen: Secondary | ICD-10-CM | POA: Diagnosis present

## 2014-09-06 DIAGNOSIS — E1165 Type 2 diabetes mellitus with hyperglycemia: Secondary | ICD-10-CM | POA: Diagnosis not present

## 2014-09-06 DIAGNOSIS — I672 Cerebral atherosclerosis: Secondary | ICD-10-CM | POA: Diagnosis not present

## 2014-09-06 DIAGNOSIS — Z91199 Patient's noncompliance with other medical treatment and regimen due to unspecified reason: Secondary | ICD-10-CM

## 2014-09-06 DIAGNOSIS — Z8673 Personal history of transient ischemic attack (TIA), and cerebral infarction without residual deficits: Secondary | ICD-10-CM

## 2014-09-06 DIAGNOSIS — R4781 Slurred speech: Secondary | ICD-10-CM | POA: Diagnosis not present

## 2014-09-06 DIAGNOSIS — N183 Chronic kidney disease, stage 3 unspecified: Secondary | ICD-10-CM

## 2014-09-06 DIAGNOSIS — I10 Essential (primary) hypertension: Secondary | ICD-10-CM | POA: Diagnosis not present

## 2014-09-06 DIAGNOSIS — G8194 Hemiplegia, unspecified affecting left nondominant side: Secondary | ICD-10-CM | POA: Diagnosis present

## 2014-09-06 DIAGNOSIS — I639 Cerebral infarction, unspecified: Secondary | ICD-10-CM | POA: Diagnosis not present

## 2014-09-06 DIAGNOSIS — Z801 Family history of malignant neoplasm of trachea, bronchus and lung: Secondary | ICD-10-CM

## 2014-09-06 DIAGNOSIS — N189 Chronic kidney disease, unspecified: Secondary | ICD-10-CM | POA: Diagnosis present

## 2014-09-06 DIAGNOSIS — Z91128 Patient's intentional underdosing of medication regimen for other reason: Secondary | ICD-10-CM | POA: Diagnosis present

## 2014-09-06 DIAGNOSIS — Z7982 Long term (current) use of aspirin: Secondary | ICD-10-CM

## 2014-09-06 DIAGNOSIS — Z8249 Family history of ischemic heart disease and other diseases of the circulatory system: Secondary | ICD-10-CM

## 2014-09-06 DIAGNOSIS — E119 Type 2 diabetes mellitus without complications: Secondary | ICD-10-CM

## 2014-09-06 DIAGNOSIS — E871 Hypo-osmolality and hyponatremia: Secondary | ICD-10-CM | POA: Diagnosis present

## 2014-09-06 DIAGNOSIS — R531 Weakness: Secondary | ICD-10-CM | POA: Diagnosis not present

## 2014-09-06 DIAGNOSIS — Z8042 Family history of malignant neoplasm of prostate: Secondary | ICD-10-CM

## 2014-09-06 DIAGNOSIS — T383X6A Underdosing of insulin and oral hypoglycemic [antidiabetic] drugs, initial encounter: Secondary | ICD-10-CM | POA: Diagnosis present

## 2014-09-06 DIAGNOSIS — I6350 Cerebral infarction due to unspecified occlusion or stenosis of unspecified cerebral artery: Secondary | ICD-10-CM | POA: Diagnosis not present

## 2014-09-06 DIAGNOSIS — K219 Gastro-esophageal reflux disease without esophagitis: Secondary | ICD-10-CM | POA: Diagnosis present

## 2014-09-06 DIAGNOSIS — Z825 Family history of asthma and other chronic lower respiratory diseases: Secondary | ICD-10-CM

## 2014-09-06 DIAGNOSIS — R739 Hyperglycemia, unspecified: Secondary | ICD-10-CM

## 2014-09-06 DIAGNOSIS — Z923 Personal history of irradiation: Secondary | ICD-10-CM

## 2014-09-06 DIAGNOSIS — Z823 Family history of stroke: Secondary | ICD-10-CM

## 2014-09-06 DIAGNOSIS — Z833 Family history of diabetes mellitus: Secondary | ICD-10-CM

## 2014-09-06 DIAGNOSIS — Z808 Family history of malignant neoplasm of other organs or systems: Secondary | ICD-10-CM

## 2014-09-06 DIAGNOSIS — I6523 Occlusion and stenosis of bilateral carotid arteries: Secondary | ICD-10-CM | POA: Diagnosis not present

## 2014-09-06 DIAGNOSIS — I6501 Occlusion and stenosis of right vertebral artery: Secondary | ICD-10-CM | POA: Diagnosis not present

## 2014-09-06 DIAGNOSIS — C61 Malignant neoplasm of prostate: Secondary | ICD-10-CM | POA: Diagnosis not present

## 2014-09-06 DIAGNOSIS — I129 Hypertensive chronic kidney disease with stage 1 through stage 4 chronic kidney disease, or unspecified chronic kidney disease: Secondary | ICD-10-CM | POA: Diagnosis present

## 2014-09-06 LAB — BASIC METABOLIC PANEL
Anion gap: 10 (ref 5–15)
BUN: 30 mg/dL — ABNORMAL HIGH (ref 6–23)
CO2: 29 mmol/L (ref 19–32)
Calcium: 9.7 mg/dL (ref 8.4–10.5)
Chloride: 94 mmol/L — ABNORMAL LOW (ref 96–112)
Creatinine, Ser: 1.91 mg/dL — ABNORMAL HIGH (ref 0.50–1.35)
GFR calc Af Amer: 41 mL/min — ABNORMAL LOW (ref >90)
GFR calc non Af Amer: 35 mL/min — ABNORMAL LOW (ref >90)
Glucose, Bld: 501 mg/dL — ABNORMAL HIGH (ref 70–99)
Potassium: 4.1 mmol/L (ref 3.5–5.1)
Sodium: 133 mmol/L — ABNORMAL LOW (ref 135–145)

## 2014-09-06 LAB — GLUCOSE, CAPILLARY
GLUCOSE-CAPILLARY: 272 mg/dL — AB (ref 70–99)
Glucose-Capillary: 183 mg/dL — ABNORMAL HIGH (ref 70–99)

## 2014-09-06 LAB — LIPID PANEL
Cholesterol: 215 mg/dL — ABNORMAL HIGH (ref 0–200)
HDL: 31 mg/dL — ABNORMAL LOW (ref >39)
LDL CALC: 119 mg/dL — AB (ref 0–99)
Total CHOL/HDL Ratio: 6.9 RATIO
Triglycerides: 323 mg/dL — ABNORMAL HIGH (ref <150)
VLDL: 65 mg/dL — ABNORMAL HIGH (ref 0–40)

## 2014-09-06 LAB — CBC WITH DIFFERENTIAL/PLATELET
Basophils Absolute: 0 10*3/uL (ref 0.0–0.1)
Basophils Relative: 0 % (ref 0–1)
Eosinophils Absolute: 0.2 10*3/uL (ref 0.0–0.7)
Eosinophils Relative: 3 % (ref 0–5)
HCT: 35.7 % — ABNORMAL LOW (ref 39.0–52.0)
Hemoglobin: 11.8 g/dL — ABNORMAL LOW (ref 13.0–17.0)
Lymphocytes Relative: 12 % (ref 12–46)
Lymphs Abs: 0.8 10*3/uL (ref 0.7–4.0)
MCH: 25.4 pg — ABNORMAL LOW (ref 26.0–34.0)
MCHC: 33.1 g/dL (ref 30.0–36.0)
MCV: 76.9 fL — ABNORMAL LOW (ref 78.0–100.0)
Monocytes Absolute: 0.3 10*3/uL (ref 0.1–1.0)
Monocytes Relative: 5 % (ref 3–12)
Neutro Abs: 5.5 10*3/uL (ref 1.7–7.7)
Neutrophils Relative %: 80 % — ABNORMAL HIGH (ref 43–77)
Platelets: 225 10*3/uL (ref 150–400)
RBC: 4.64 MIL/uL (ref 4.22–5.81)
RDW: 12.8 % (ref 11.5–15.5)
WBC: 6.9 10*3/uL (ref 4.0–10.5)

## 2014-09-06 LAB — CBG MONITORING, ED: Glucose-Capillary: 340 mg/dL — ABNORMAL HIGH (ref 70–99)

## 2014-09-06 MED ORDER — SODIUM CHLORIDE 0.9 % IV BOLUS (SEPSIS): 1000.0000 mL | Freq: Once | INTRAVENOUS | Status: DC

## 2014-09-06 MED ORDER — SIMETHICONE 40 MG/0.6ML PO SUSP
ORAL | Status: AC
  Filled 2014-09-06: qty 1.8

## 2014-09-06 MED ORDER — INSULIN ASPART 100 UNIT/ML ~~LOC~~ SOLN
0.0000 [IU] | Freq: Every day | SUBCUTANEOUS | Status: DC
  Administered 2014-09-07: 2 [IU] via SUBCUTANEOUS

## 2014-09-06 MED ORDER — CLOPIDOGREL BISULFATE 75 MG PO TABS
75.0000 mg | ORAL_TABLET | Freq: Every day | ORAL | Status: DC
  Administered 2014-09-06 – 2014-09-08 (×3): 75 mg via ORAL
  Filled 2014-09-06 (×3): qty 1

## 2014-09-06 MED ORDER — ENOXAPARIN SODIUM 40 MG/0.4ML ~~LOC~~ SOLN
40.0000 mg | SUBCUTANEOUS | Status: DC
  Administered 2014-09-06 – 2014-09-07 (×2): 40 mg via SUBCUTANEOUS
  Filled 2014-09-06 (×2): qty 0.4

## 2014-09-06 MED ORDER — ACETAMINOPHEN 325 MG PO TABS: 650.0000 mg | ORAL_TABLET | ORAL | Status: DC | PRN

## 2014-09-06 MED ORDER — INSULIN ASPART 100 UNIT/ML ~~LOC~~ SOLN
12.0000 [IU] | Freq: Once | SUBCUTANEOUS | Status: AC
  Administered 2014-09-06: 12 [IU] via INTRAVENOUS
  Filled 2014-09-06: qty 1

## 2014-09-06 MED ORDER — SODIUM CHLORIDE 0.9 % IV BOLUS (SEPSIS)
2000.0000 mL | Freq: Once | INTRAVENOUS | Status: AC
  Administered 2014-09-06: 2000 mL via INTRAVENOUS

## 2014-09-06 MED ORDER — ACETAMINOPHEN 650 MG RE SUPP: 650.0000 mg | RECTAL | Status: DC | PRN

## 2014-09-06 MED ORDER — INSULIN ASPART 100 UNIT/ML ~~LOC~~ SOLN
0.0000 [IU] | Freq: Three times a day (TID) | SUBCUTANEOUS | Status: DC
  Administered 2014-09-06: 8 [IU] via SUBCUTANEOUS
  Administered 2014-09-07 (×3): 5 [IU] via SUBCUTANEOUS
  Administered 2014-09-08: 2 [IU] via SUBCUTANEOUS
  Administered 2014-09-08: 8 [IU] via SUBCUTANEOUS

## 2014-09-06 MED ORDER — STROKE: EARLY STAGES OF RECOVERY BOOK
Freq: Once | Status: AC
  Administered 2014-09-06: 18:00:00
  Filled 2014-09-06: qty 1

## 2014-09-06 MED ORDER — SODIUM CHLORIDE 0.9 % IV SOLN
INTRAVENOUS | Status: DC
  Administered 2014-09-06 – 2014-09-08 (×4): via INTRAVENOUS

## 2014-09-06 MED ORDER — SENNOSIDES-DOCUSATE SODIUM 8.6-50 MG PO TABS: 1.0000 | ORAL_TABLET | Freq: Every evening | ORAL | Status: DC | PRN

## 2014-09-06 NOTE — ED Provider Notes (Signed)
CSN: [id number]     Arrival date & time 09/06/14  1104 History  This chart was scribed for Virgel Manifold, MD by Mercy Moore, ED scribe.  This patient was seen in room APA16A/APA16A and the patient's care was started at 11:44 AM.   Chief Complaint  Patient presents with  . Extremity Weakness   The history is provided by the patient. No language interpreter was used.   HPI Comments: Jim [name] is a 64 y.o. male with PMHx of Hypertension, Diabetes Mellitus, prostate cancer, and chronic kidney disease who presents to the Emergency Department complaining of worsening left hemilateral weakness with onset three days ago. Patient reports worsening this morning upon awakening. Since onset of symptoms patient reports severe cramping in his left leg and stiffening left hand pain. Patient reports development of altered/slurred speech this morning with drooling; patient denies aphasia. Patient denies history similar symptoms.  Per nursing note patient states that he was attempting to wait tomorrow until he could visit his PCP, but his symptoms have become too concerning.  Patient is right hand dominant.   Past Medical History  Diagnosis Date  . Hypertension   . Diabetes mellitus   . Chronic kidney disease     cyst on left kidney   . GERD (gastroesophageal reflux disease)   . Degenerative disc disease, lumbar   . Prostate cancer 06/11/12    gleason 7  . Hx of radiation therapy 12/13/12- 01/27/13    prostate bed 6600 cGy 33 sessions   Past Surgical History  Procedure Laterality Date  . Robot assisted laparoscopic radical prostatectomy  06/11/2012    Procedure: ROBOTIC ASSISTED LAPAROSCOPIC RADICAL PROSTATECTOMY;  Surgeon: Alexis Frock, MD;  Location: [facility];  Service: Urology;  Laterality: N/A;  1st procedure: Robotic Left Renal Cyst Decortication   . Lymphadenectomy  06/11/2012    Procedure: LYMPHADENECTOMY;  Surgeon: Alexis Frock, MD;  Location: [facility];  Service: Urology;  Laterality:  Bilateral;  . Robotic assited partial nephrectomy  06/11/2012    Procedure: ROBOTIC ASSITED PARTIAL NEPHRECTOMY;  Surgeon: Alexis Frock, MD;  Location: [facility];  Service: Urology;  Laterality: Left;  . Prostate biopsy  03/25/2012   Family History  Problem Relation Age of Onset  . Cancer Mother     pancreatic  . Cancer Father     lung  . Cancer Brother     prostate-surgery 6 years ago   History  Substance Use Topics  . Smoking status: Never Smoker   . Smokeless tobacco: Not on file  . Alcohol Use: No     Comment: no use in 1 year per patient     Review of Systems  Constitutional: Negative for fever and chills.  HENT: Negative for congestion and sore throat.   Eyes: Negative for pain.  Respiratory: Negative for cough, shortness of breath and wheezing.   Cardiovascular: Negative for chest pain.  Gastrointestinal: Negative for nausea, vomiting, abdominal pain and diarrhea.  Genitourinary: Negative for dysuria.  Musculoskeletal: Positive for myalgias. Negative for neck stiffness.  Skin: Negative for rash.  Allergic/Immunologic: Negative for immunocompromised state.  Neurological: Positive for speech difficulty and weakness. Negative for headaches.  Hematological: Negative for adenopathy.  Psychiatric/Behavioral: Negative for behavioral problems.  All other systems reviewed and are negative.     Allergies  Review of patient's allergies indicates no known allergies.  Home Medications   Prior to Admission medications   Medication Sig Start Date End Date Taking? Authorizing Provider  acetaminophen (TYLENOL) 325 MG tablet Take  650 mg by mouth every 6 (six) hours as needed for pain.    Historical Provider, MD  amLODipine (NORVASC) 10 MG tablet Take 10 mg by mouth every morning.    Historical Provider, MD  glipiZIDE (GLUCOTROL XL) 10 MG 24 hr tablet Take 10 mg by mouth every morning.    Historical Provider, MD  insulin NPH (HUMULIN N,NOVOLIN N) 100 UNIT/ML injection Inject 10  Units into the skin at bedtime.    Historical Provider, MD  losartan-hydrochlorothiazide (HYZAAR) 50-12.5 MG per tablet Take 1 tablet by mouth every morning.    Historical Provider, MD  metFORMIN (GLUCOPHAGE-XR) 500 MG 24 hr tablet Take 500 mg by mouth 2 (two) times daily.    Historical Provider, MD  naproxen sodium (ANAPROX) 220 MG tablet Take 220 mg by mouth 2 (two) times daily with a meal.    Historical Provider, MD  sennosides-docusate sodium (SENOKOT-S) 8.6-50 MG tablet Take 1 tablet by mouth 2 (two) times daily. While taking pain meds to prevent constipation 06/13/12   Alexis Frock, MD  simvastatin (ZOCOR) 20 MG tablet Take 20 mg by mouth at bedtime.    Historical Provider, MD   Ht 5\' 8"  (1.727 m)  Wt 195 lb (88.451 kg)  BMI 29.66 kg/m2 Physical Exam  Constitutional: He is oriented to person, place, and time. He appears well-developed and well-nourished. No distress.  HENT:  Head: Normocephalic and atraumatic.  Eyes: EOM are normal.  Neck: Neck supple. No tracheal deviation present.  Cardiovascular: Normal rate.   Pulmonary/Chest: Effort normal. No respiratory distress.  Musculoskeletal: Normal range of motion.  Neurological: He is alert and oriented to person, place, and time. No cranial nerve deficit. Coordination normal.  Strength 5/5 in upper right and lower extrem 4/5 in left lower and upper extremities.   Skin: Skin is warm and dry.  Psychiatric: He has a normal mood and affect. His behavior is normal.  Nursing note and vitals reviewed.   ED Course  Procedures (including critical care time)  COORDINATION OF CARE: 11:49 AM- Plans to await resulting CT of chest, order blood work and EKG. Ultimately, patient to be admitted to hospital for further evaluation given concern for stroke. Discussed treatment plan with patient at bedside and patient agreed to plan.   Labs Review Labs Reviewed  CBC WITH DIFFERENTIAL/PLATELET - Abnormal; Notable for the following:    Hemoglobin  11.8 (*)    HCT 35.7 (*)    MCV 76.9 (*)    MCH 25.4 (*)    Neutrophils Relative % 80 (*)    All other components within normal limits  BASIC METABOLIC PANEL - Abnormal; Notable for the following:    Sodium 133 (*)    Chloride 94 (*)    Glucose, Bld 501 (*)    BUN 30 (*)    Creatinine, Ser 1.91 (*)    GFR calc non Af Amer 35 (*)    GFR calc Af Amer 41 (*)    All other components within normal limits  HEMOGLOBIN A1C - Abnormal; Notable for the following:    Hgb A1c MFr Bld 10.5 (*)    All other components within normal limits  LIPID PANEL - Abnormal; Notable for the following:    Cholesterol 215 (*)    Triglycerides 323 (*)    HDL 31 (*)    VLDL 65 (*)    LDL Cholesterol 119 (*)    All other components within normal limits  GLUCOSE, CAPILLARY - Abnormal; Notable for the  following:    Glucose-Capillary 272 (*)    All other components within normal limits  GLUCOSE, CAPILLARY - Abnormal; Notable for the following:    Glucose-Capillary 183 (*)    All other components within normal limits  GLUCOSE, CAPILLARY - Abnormal; Notable for the following:    Glucose-Capillary 243 (*)    All other components within normal limits  GLUCOSE, CAPILLARY - Abnormal; Notable for the following:    Glucose-Capillary 241 (*)    All other components within normal limits  GLUCOSE, CAPILLARY - Abnormal; Notable for the following:    Glucose-Capillary 214 (*)    All other components within normal limits  BASIC METABOLIC PANEL - Abnormal; Notable for the following:    Glucose, Bld 179 (*)    Creatinine, Ser 1.53 (*)    GFR calc non Af Amer 46 (*)    GFR calc Af Amer 53 (*)    All other components within normal limits  GLUCOSE, CAPILLARY - Abnormal; Notable for the following:    Glucose-Capillary 217 (*)    All other components within normal limits  HOMOCYSTEINE - Abnormal; Notable for the following:    Homocysteine 17.3 (*)    All other components within normal limits  GLUCOSE, CAPILLARY -  Abnormal; Notable for the following:    Glucose-Capillary 150 (*)    All other components within normal limits  ANTITHROMBIN III - Abnormal; Notable for the following:    AntiThromb III Func 138 (*)    All other components within normal limits  GLUCOSE, CAPILLARY - Abnormal; Notable for the following:    Glucose-Capillary 253 (*)    All other components within normal limits  CBG MONITORING, ED - Abnormal; Notable for the following:    Glucose-Capillary 340 (*)    All other components within normal limits  VITAMIN B12  RPR  TSH  BETA-2-GLYCOPROTEIN I ABS, IGG/M/A  PROTEIN C ACTIVITY  PROTEIN C, TOTAL  PROTEIN S ACTIVITY  PROTEIN S, TOTAL  LUPUS ANTICOAGULANT PANEL  FACTOR 5 LEIDEN  PROTHROMBIN GENE MUTATION  CARDIOLIPIN ANTIBODIES, IGG, IGM, IGA    Imaging Review No results found.   Dg Chest 2 View  09/06/2014   CLINICAL DATA:  Left-sided weakness ; hypertension  EXAM: CHEST  2 VIEW  COMPARISON:  July 09, 2011  FINDINGS: There is no edema or consolidation. Heart size and pulmonary vascularity are normal. No adenopathy. There is degenerative change in the thoracic spine.  IMPRESSION: No edema or consolidation.   Electronically Signed   By: Lowella Grip III M.D.   On: 09/06/2014 17:07   Ct Head Wo Contrast  09/06/2014   CLINICAL DATA:  LEFT side weakness for 3 days, slurred speech since yesterday, history diabetes, hypertension, prostate cancer post radiation therapy, chronic kidney disease  EXAM: CT HEAD WITHOUT CONTRAST  TECHNIQUE: Contiguous axial images were obtained from the base of the skull through the vertex without intravenous contrast.  COMPARISON:  10/05/2010  FINDINGS: Generalized atrophy.  Normal ventricular morphology.  No midline shift or mass effect.  Small vessel chronic ischemic changes of deep cerebral white matter.  Old appearing LEFT external capsule lacunar infarct.  No intracranial hemorrhage, mass lesion, or acute infarction.  Visualized paranasal  sinuses and mastoid air cells clear.  Small calvarial exostosis LEFT frontal bone unchanged.  Bones otherwise unremarkable.  Atherosclerotic calcifications within internal carotid and vertebral arteries at skullbase.  IMPRESSION: Atrophy with small vessel chronic ischemic changes of deep cerebral white matter.  Old lacunar infarct LEFT  external capsule.  No acute intracranial abnormalities.   Electronically Signed   By: Lavonia Dana M.D.   On: 09/06/2014 11:49   Mr Brain Wo Contrast  09/06/2014   CLINICAL DATA:  Left-sided weakness beginning 3 days ago. Speech disturbance today.  EXAM: MRI HEAD WITHOUT CONTRAST  MRA HEAD WITHOUT CONTRAST  TECHNIQUE: Multiplanar, multiecho pulse sequences of the brain and surrounding structures were obtained without intravenous contrast. Angiographic images of the head were obtained using MRA technique without contrast.  COMPARISON:  Head CT 09/06/2014  FINDINGS: MRI HEAD FINDINGS  There are a few subcentimeter foci of acute infarction in the right pons. A focus of chronic microhemorrhage is noted in the right frontal operculum. There is no evidence of mass, midline shift, or extra-axial fluid collection. There is mild generalized cerebral atrophy. Patchy and confluent T2 hyperintensities in the periventricular greater than subcortical cerebral white matter are advanced for age and nonspecific but compatible with moderate chronic small vessel ischemic disease. Chronic left basal ganglia lacunar infarct is noted.  Prior bilateral cataract extraction is noted. Paranasal sinuses and mastoid air cells are clear. Major intracranial vascular flow voids are preserved.  MRA HEAD FINDINGS  Decreased intensity of flow related enhancement in the distal V3 and proximal V4 segments is likely artifactual. The left vertebral artery is hypoplastic and ends in PICA. The right vertebral artery is dominant and supplies the basilar. Right PICA origin is patent. There is atherosclerotic irregularity  with areas of mild to moderate narrowing involving the distal right vertebral artery and entire basilar artery. The left SCA origin is patent. Right SCA origin appears patent, however a severe proximal right SCA stenosis is suspected. There is a fetal origin of the right PCA. A patent left posterior communicating artery is also present. There is a severe proximal left P2 stenosis. There is also a severe mid right P2 stenosis.  Internal carotid arteries are patent from skullbase to carotid termini. There is mild narrowing of the proximal left cavernous and left supraclinoid ICA. There is a mild-to-moderate proximal left A1 stenosis. ACAs are otherwise unremarkable. MCAs are patent without significant stenosis. No intracranial aneurysm is identified.  IMPRESSION: 1. Acute right pontine infarcts. 2. Moderate chronic small vessel ischemic disease and chronic left basal ganglia lacunar infarct. 3. No major intracranial arterial occlusion. Advanced posterior circulation atherosclerosis with moderate distal right vertebral and basilar artery stenosis and severe bilateral P2 PCA stenoses. 4. Mild left ICA and mild-to-moderate proximal left ACA stenoses.   Electronically Signed   By: Logan Bores   On: 09/06/2014 18:05   US Carotid Bilateral  09/06/2014   CLINICAL DATA:  64 year old male with a history of left-sided weakness for 3 days.  Cardiovascular risk factors include hypertension, prior stroke/ TIA, diabetes  EXAM: BILATERAL CAROTID DUPLEX ULTRASOUND  TECHNIQUE: Pearline Cables scale imaging, color Doppler and duplex ultrasound were performed of bilateral carotid and vertebral arteries in the neck.  COMPARISON:  None  FINDINGS: Criteria: Quantification of carotid stenosis is based on velocity parameters that correlate the residual internal carotid diameter with NASCET-based stenosis levels, using the diameter of the distal internal carotid lumen as the denominator for stenosis measurement.  The following velocity measurements  were obtained:  RIGHT  ICA:  Systolic 73 cm/sec, Diastolic 22 cm/sec  CCA:  93 cm/sec  SYSTOLIC ICA/CCA RATIO:  0.8  ECA:  83 cm/sec  LEFT  ICA:  Systolic 70 cm/sec, Diastolic 15 cm/sec  CCA:  818 cm/sec  SYSTOLIC ICA/CCA RATIO:  0.5  ECA:  109 cm/sec  Right Brachial SBP: Not acquired  Left Brachial SBP: Not acquired  RIGHT CAROTID ARTERY: Mild atherosclerotic changes of the right common carotid artery. Intermediate waveform maintained. Heterogeneous plaque at the right carotid bifurcation without significant calcifications. Low resistance waveform of the right ICA.  RIGHT VERTEBRAL ARTERY: Antegrade flow with low resistance waveform.  LEFT CAROTID ARTERY: Mild atherosclerotic changes of the left common carotid artery. Intermediate waveform maintained. Heterogeneous plaque at the left carotid bifurcation without significant calcifications. Low resistance waveform of the left ICA.  LEFT VERTEBRAL ARTERY:  Antegrade flow with low resistance waveform.  IMPRESSION: Color duplex indicates minimal heterogeneous plaque, with no hemodynamically significant stenosis by duplex criteria in the extracranial cerebrovascular circulation.  Signed,  Dulcy Fanny. Earleen Newport, DO  [facility]  [facility]   Electronically Signed   By: Corrie Mckusick D.O.   On: 09/06/2014 17:27   Mr Jodene Nam Head/brain Wo Cm  09/06/2014   CLINICAL DATA:  Left-sided weakness beginning 3 days ago. Speech disturbance today.  EXAM: MRI HEAD WITHOUT CONTRAST  MRA HEAD WITHOUT CONTRAST  TECHNIQUE: Multiplanar, multiecho pulse sequences of the brain and surrounding structures were obtained without intravenous contrast. Angiographic images of the head were obtained using MRA technique without contrast.  COMPARISON:  Head CT 09/06/2014  FINDINGS: MRI HEAD FINDINGS  There are a few subcentimeter foci of acute infarction in the right pons. A focus of chronic microhemorrhage is noted in the right frontal operculum. There is no  evidence of mass, midline shift, or extra-axial fluid collection. There is mild generalized cerebral atrophy. Patchy and confluent T2 hyperintensities in the periventricular greater than subcortical cerebral white matter are advanced for age and nonspecific but compatible with moderate chronic small vessel ischemic disease. Chronic left basal ganglia lacunar infarct is noted.  Prior bilateral cataract extraction is noted. Paranasal sinuses and mastoid air cells are clear. Major intracranial vascular flow voids are preserved.  MRA HEAD FINDINGS  Decreased intensity of flow related enhancement in the distal V3 and proximal V4 segments is likely artifactual. The left vertebral artery is hypoplastic and ends in PICA. The right vertebral artery is dominant and supplies the basilar. Right PICA origin is patent. There is atherosclerotic irregularity with areas of mild to moderate narrowing involving the distal right vertebral artery and entire basilar artery. The left SCA origin is patent. Right SCA origin appears patent, however a severe proximal right SCA stenosis is suspected. There is a fetal origin of the right PCA. A patent left posterior communicating artery is also present. There is a severe proximal left P2 stenosis. There is also a severe mid right P2 stenosis.  Internal carotid arteries are patent from skullbase to carotid termini. There is mild narrowing of the proximal left cavernous and left supraclinoid ICA. There is a mild-to-moderate proximal left A1 stenosis. ACAs are otherwise unremarkable. MCAs are patent without significant stenosis. No intracranial aneurysm is identified.  IMPRESSION: 1. Acute right pontine infarcts. 2. Moderate chronic small vessel ischemic disease and chronic left basal ganglia lacunar infarct. 3. No major intracranial arterial occlusion. Advanced posterior circulation atherosclerosis with moderate distal right vertebral and basilar artery stenosis and severe bilateral P2 PCA  stenoses. 4. Mild left ICA and mild-to-moderate proximal left ACA stenoses.   Electronically Signed   By: Logan Bores   On: 09/06/2014 18:05   EKG Interpretation   Date/Time:  Wednesday September 06 2014 11:11:16 EDT Ventricular Rate:  90 PR Interval:  155 QRS Duration:  85 QT Interval:  364 QTC Calculation: 445 R Axis:   2 Text Interpretation:  Sinus rhythm Baseline wander in lead(s) V4 V5 No  significant change since last tracing Confirmed by Ihor Meinzer  MD, Jennea Rager  ([id number]) on 09/06/2014 11:56:39 AM      MDM   Final diagnoses:  Left-sided weakness  Hyperglycemia    63yM with hyperglycemia and symptoms of CVA. Admission for further tx/eval.   I personally preformed the services scribed in my presence. The recorded information has been reviewed is accurate. Virgel Manifold, MD.    Virgel Manifold, MD 09/11/14 [phone]

## 2014-09-06 NOTE — Progress Notes (Signed)
Patient taken x-ray via wheelchair. Wife at bedside.

## 2014-09-06 NOTE — H&P (Signed)
[facility] History and Physical  Rhydian Baldi JEH:[id number] DOB: Aug 09, 1950 DOA: 09/06/2014  Referring physician:  PCP: Petra Kuba, MD   Chief Complaint:   HPI: Kyndall Chaplin is a very pleasant  64 y.o. male a past medical history that includes hypertension, diabetes, chronic kidney disease, GERD, noncompliance, prostate cancer status post radiation therapy presents to the emergency department with the chief complaint gradual onset and worsening of left-sided weakness. Initial evaluation reveals hyperglycemia with a serum glucose of 501 and is concerning for CVA.   He reports that about 5 days ago he "ran out of my diabetes medicine". About 3 days ago started with some intermittent weakness and muscle cramping of the left leg particularly. Associated symptoms include cramping of the left hand, cramping of the left leg, dizziness, left lower extremity weakness, and some slow slurred speech. This morning he awakened and "my leg wouldn't work" so he decided come to the emergency room.    He denies chest pain palpitations headache visual disturbances numbness or tingling. He denies difficulty swallowing diaphoresis shortness of breath abdominal pain. He denies dysuria hematuria frequency or urgency.   Workup includes includes comprehensive metabolic panel significant for sodium of 133 chloride 94 BUN 30 creatinine 1.91 serum glucose 501, complete blood counts significant for hemoglobin of 11.8. CT of the head reveals atrophy with small vessel chronic ischemic changes as well as old lacunar infarct left external capsule and no acute intracranial abnormalities. He is hemodynamically stable afebrile and not hypoxic. In the emergency department he is given 12 units of NovoLog insulin as well as 2 L of normal saline.   Review of Systems:  10 point review of systems completed and all systems are negative except as indicated in the history of present illness.  Past Medical History   Diagnosis Date  . Hypertension   . Diabetes mellitus   . Chronic kidney disease     cyst on left kidney   . GERD (gastroesophageal reflux disease)   . Degenerative disc disease, lumbar   . Prostate cancer 06/11/12    gleason 7  . Hx of radiation therapy 12/13/12- 01/27/13    prostate bed 6600 cGy 33 sessions   Past Surgical History  Procedure Laterality Date  . Robot assisted laparoscopic radical prostatectomy  06/11/2012    Procedure: ROBOTIC ASSISTED LAPAROSCOPIC RADICAL PROSTATECTOMY;  Surgeon: Alexis Frock, MD;  Location: [facility];  Service: Urology;  Laterality: N/A;  1st procedure: Robotic Left Renal Cyst Decortication   . Lymphadenectomy  06/11/2012    Procedure: LYMPHADENECTOMY;  Surgeon: Alexis Frock, MD;  Location: [facility];  Service: Urology;  Laterality: Bilateral;  . Robotic assited partial nephrectomy  06/11/2012    Procedure: ROBOTIC ASSITED PARTIAL NEPHRECTOMY;  Surgeon: Alexis Frock, MD;  Location: [facility];  Service: Urology;  Laterality: Left;  . Prostate biopsy  03/25/2012   Social History:  reports that he has never smoked. He does not have any smokeless tobacco history on file. He reports that he does not drink alcohol or use illicit drugs. He is married lives at home with his wife has done so for the last 27 years. He is a retired Engineer, manufacturing systems. He is independent with ADLs. There have been no recent falls No Known Allergies  Family History  Problem Relation Age of Onset  . Cancer Mother     pancreatic  . Cancer Father     lung  . Cancer Brother     prostate-surgery 6 years ago   mother deceased  due to complications from uncontrolled diabetes he has 3 siblings their collective medical history is positive for hypertension diabetes and stroke father deceased from complications of COPD  Prior to Admission medications   Medication Sig Start Date End Date Taking? Authorizing Provider  amLODipine (NORVASC) 10 MG tablet Take 10 mg by mouth every morning.   Yes  Historical Provider, MD  aspirin EC 81 MG tablet Take 81 mg by mouth daily.   Yes Historical Provider, MD  glipiZIDE (GLUCOTROL XL) 10 MG 24 hr tablet Take 10 mg by mouth every morning.   Yes Historical Provider, MD  insulin NPH (HUMULIN N,NOVOLIN N) 100 UNIT/ML injection Inject 12 Units into the skin at bedtime.    Yes Historical Provider, MD  losartan-hydrochlorothiazide (HYZAAR) 100-25 MG per tablet Take 1 tablet by mouth daily. 07/26/14  Yes Historical Provider, MD  metFORMIN (GLUCOPHAGE-XR) 500 MG 24 hr tablet Take 1,000 mg by mouth 2 (two) times daily.    Yes Historical Provider, MD  metoprolol succinate (TOPROL-XL) 50 MG 24 hr tablet Take 1 tablet by mouth daily. 07/26/14  Yes Historical Provider, MD  naproxen sodium (ANAPROX) 220 MG tablet Take 220 mg by mouth 2 (two) times daily as needed (pain).   Yes Historical Provider, MD  sennosides-docusate sodium (SENOKOT-S) 8.6-50 MG tablet Take 1 tablet by mouth 2 (two) times daily. While taking pain meds to prevent constipation Patient not taking: Reported on 09/06/2014 06/13/12   Alexis Frock, MD   Physical Exam: Filed Vitals:   09/06/14 1111 09/06/14 1256 09/06/14 1330 09/06/14 1410  BP:  164/83 159/90   Pulse:  81 81   Temp:    98 F (36.7 C)  Resp:  18 12   Height: 5\' 8"  (1.727 m)     Weight: 88.451 kg (195 lb)     SpO2:  98% 96%     Wt Readings from Last 3 Encounters:  09/06/14 88.451 kg (195 lb)  01/25/13 91.989 kg (202 lb 12.8 oz)  01/03/13 92.987 kg (205 lb)    General:  Appears calm and comfortable Eyes: PERRL, normal lids, irises & conjunctiva ENT: grossly normal hearing, lips & tongue, mucous membranes of his mouth are pink but dry Neck: no LAD, masses or thyromegaly. Range of motion Cardiovascular: RRR, no m/r/g. No LE edema. Respiratory: CTA bilaterally, no w/r/r. Normal respiratory effort. Abdomen: soft,  positive bowel sounds nontender to palpation Skin: no rash or induration seen on limited exam Musculoskeletal:  grossly normal tone BUE/BLE Psychiatric: grossly normal mood and affect, speech fluent and appropriate Neurologic: Speech is very slow slightly slurred may be mild word searching. Bilateral grips 5 out of 5 lower extremity strength 5 out of 5           Labs on Admission:  Basic Metabolic Panel:  Recent Labs Lab 09/06/14 1201  NA 133*  K 4.1  CL 94*  CO2 29  GLUCOSE 501*  BUN 30*  CREATININE 1.91*  CALCIUM 9.7   Liver Function Tests: No results for input(s): AST, ALT, ALKPHOS, BILITOT, PROT, ALBUMIN in the last 168 hours. No results for input(s): LIPASE, AMYLASE in the last 168 hours. No results for input(s): AMMONIA in the last 168 hours. CBC:  Recent Labs Lab 09/06/14 1201  WBC 6.9  NEUTROABS 5.5  HGB 11.8*  HCT 35.7*  MCV 76.9*  PLT 225   Cardiac Enzymes: No results for input(s): CKTOTAL, CKMB, CKMBINDEX, TROPONINI in the last 168 hours.  BNP (last 3 results) No results for  input(s): BNP in the last 8760 hours.  ProBNP (last 3 results) No results for input(s): PROBNP in the last 8760 hours.  CBG: No results for input(s): GLUCAP in the last 168 hours.  Radiological Exams on Admission: Ct Head Wo Contrast  09/06/2014   CLINICAL DATA:  LEFT side weakness for 3 days, slurred speech since yesterday, history diabetes, hypertension, prostate cancer post radiation therapy, chronic kidney disease  EXAM: CT HEAD WITHOUT CONTRAST  TECHNIQUE: Contiguous axial images were obtained from the base of the skull through the vertex without intravenous contrast.  COMPARISON:  10/05/2010  FINDINGS: Generalized atrophy.  Normal ventricular morphology.  No midline shift or mass effect.  Small vessel chronic ischemic changes of deep cerebral white matter.  Old appearing LEFT external capsule lacunar infarct.  No intracranial hemorrhage, mass lesion, or acute infarction.  Visualized paranasal sinuses and mastoid air cells clear.  Small calvarial exostosis LEFT frontal bone unchanged.   Bones otherwise unremarkable.  Atherosclerotic calcifications within internal carotid and vertebral arteries at skullbase.  IMPRESSION: Atrophy with small vessel chronic ischemic changes of deep cerebral white matter.  Old lacunar infarct LEFT external capsule.  No acute intracranial abnormalities.   Electronically Signed   By: Lavonia Dana M.D.   On: 09/06/2014 11:49    EKG: Sinus rhythm  Assessment/Plan Principal Problem:   CVA (cerebral vascular accident): Will admit to telemetry as presentation concerning for CVA. Risk factors include family medical history hypertension uncontrolled diabetes. Will obtain an MRI/MRA, 2-D echo, carotid Dopplers. Will check hemoglobin A1c and lipid panel. He passed a swallow eval in the emergency department so we will provide a heart healthy car modified diet. He was already on aspirin so we'll switch him to Plavix and start a statin. Last PT and OT to evaluate. His blood sugar under control some of his symptoms could be attributable to electrolyte imbalance secondary to hyperglycemia. Active Problems:   Diabetes with hyperglycemia; patient admits to noncompliance. His primary care provider put him on insulin which he hasn't taken in over a year. He currently reports having run out of his oral medication 5 days ago. He's also been instructed to check his blood sugar once a day and he hasn't done that in over 2 years. Serum glucose 501 on admission. He was given 12 units of insulin in the emergency department and his CBG came down to 340. Will provide sliding scale insulin,  obtain a hemoglobin A1c and request diabetes coordinator for education and encouragement was compliance. I had a very lengthy discussion with patient about the effects uncontrolled diabetes    Chronic kidney disease: Likely stage 3-4. Difficult to know what his baseline creatinine is lab work in chart more than 64 years old and at that time his creatinine was within the limits of normal. Creatinine on  admission 1.9. Will hydrate fairly vigorously given #2. Will monitor his urine output. Hold any nephrotoxins and recheck in the morning  Hypertension; pressure range 159/90-160/84 while in the emergency department. Home medications include amlodipine, Hyzaar, Toprol-XL. will hold these for now to allow for permissive hypertension. Monitor closely. Will resume as indicated probably starting with Norvasc.    GERD (gastroesophageal reflux disease): Appears stable at baseline    Hyponatremia: Secondary to #2. Will provide IV fluids. Recheck in the morning.    Prostate cancer: Last treatment 2014    Left-sided weakness: See #1. Patient does describe muscle cramps and spasms could be related to hyperglycemia as well. Request PT evaluation  Non compliance with medical treatment    Code Status: full DVT Prophylaxis: Family Communication: wife at bedside Disposition Plan:   Time spent: 16 minutes  [facility] Pager [phone]

## 2014-09-06 NOTE — Plan of Care (Signed)
Problem: Acute Treatment Outcomes Goal: Neuro exam at baseline or improved Outcome: Progressing Left side weakness noted

## 2014-09-06 NOTE — Progress Notes (Signed)
  Echocardiogram 2D Echocardiogram has been performed.  Jim [name] 09/06/2014, 4:31 PM

## 2014-09-06 NOTE — ED Notes (Signed)
Pt states he began having left sided weakness three days ago. States yesterday is speech was garbled. States he was trying to wait until Thursday when he went to his pcp

## 2014-09-07 DIAGNOSIS — Z91128 Patient's intentional underdosing of medication regimen for other reason: Secondary | ICD-10-CM | POA: Diagnosis present

## 2014-09-07 DIAGNOSIS — Z8673 Personal history of transient ischemic attack (TIA), and cerebral infarction without residual deficits: Secondary | ICD-10-CM | POA: Diagnosis not present

## 2014-09-07 DIAGNOSIS — E1165 Type 2 diabetes mellitus with hyperglycemia: Secondary | ICD-10-CM | POA: Diagnosis present

## 2014-09-07 DIAGNOSIS — T383X6A Underdosing of insulin and oral hypoglycemic [antidiabetic] drugs, initial encounter: Secondary | ICD-10-CM | POA: Diagnosis present

## 2014-09-07 DIAGNOSIS — Z8249 Family history of ischemic heart disease and other diseases of the circulatory system: Secondary | ICD-10-CM | POA: Diagnosis not present

## 2014-09-07 DIAGNOSIS — Z9119 Patient's noncompliance with other medical treatment and regimen: Secondary | ICD-10-CM | POA: Diagnosis present

## 2014-09-07 DIAGNOSIS — Z8042 Family history of malignant neoplasm of prostate: Secondary | ICD-10-CM | POA: Diagnosis not present

## 2014-09-07 DIAGNOSIS — Z923 Personal history of irradiation: Secondary | ICD-10-CM | POA: Diagnosis not present

## 2014-09-07 DIAGNOSIS — R531 Weakness: Secondary | ICD-10-CM | POA: Diagnosis present

## 2014-09-07 DIAGNOSIS — Z7982 Long term (current) use of aspirin: Secondary | ICD-10-CM | POA: Diagnosis not present

## 2014-09-07 DIAGNOSIS — I129 Hypertensive chronic kidney disease with stage 1 through stage 4 chronic kidney disease, or unspecified chronic kidney disease: Secondary | ICD-10-CM | POA: Diagnosis present

## 2014-09-07 DIAGNOSIS — Z823 Family history of stroke: Secondary | ICD-10-CM | POA: Diagnosis not present

## 2014-09-07 DIAGNOSIS — C61 Malignant neoplasm of prostate: Secondary | ICD-10-CM | POA: Diagnosis present

## 2014-09-07 DIAGNOSIS — Z833 Family history of diabetes mellitus: Secondary | ICD-10-CM | POA: Diagnosis not present

## 2014-09-07 DIAGNOSIS — Z808 Family history of malignant neoplasm of other organs or systems: Secondary | ICD-10-CM | POA: Diagnosis not present

## 2014-09-07 DIAGNOSIS — G8194 Hemiplegia, unspecified affecting left nondominant side: Secondary | ICD-10-CM | POA: Diagnosis present

## 2014-09-07 DIAGNOSIS — Z825 Family history of asthma and other chronic lower respiratory diseases: Secondary | ICD-10-CM | POA: Diagnosis not present

## 2014-09-07 DIAGNOSIS — N183 Chronic kidney disease, stage 3 (moderate): Secondary | ICD-10-CM | POA: Diagnosis present

## 2014-09-07 DIAGNOSIS — K219 Gastro-esophageal reflux disease without esophagitis: Secondary | ICD-10-CM | POA: Diagnosis present

## 2014-09-07 DIAGNOSIS — E871 Hypo-osmolality and hyponatremia: Secondary | ICD-10-CM | POA: Diagnosis present

## 2014-09-07 DIAGNOSIS — Z801 Family history of malignant neoplasm of trachea, bronchus and lung: Secondary | ICD-10-CM | POA: Diagnosis not present

## 2014-09-07 DIAGNOSIS — I639 Cerebral infarction, unspecified: Secondary | ICD-10-CM | POA: Diagnosis not present

## 2014-09-07 LAB — GLUCOSE, CAPILLARY
GLUCOSE-CAPILLARY: 243 mg/dL — AB (ref 70–99)
GLUCOSE-CAPILLARY: 214 mg/dL — AB (ref 70–99)
Glucose-Capillary: 241 mg/dL — ABNORMAL HIGH (ref 70–99)
Glucose-Capillary: 217 mg/dL — ABNORMAL HIGH (ref 70–99)

## 2014-09-07 LAB — HEMOGLOBIN A1C
Hgb A1c MFr Bld: 10.5 % — ABNORMAL HIGH (ref 4.8–5.6)
Mean Plasma Glucose: 255 mg/dL

## 2014-09-07 MED ORDER — CLOPIDOGREL BISULFATE 75 MG PO TABS: 75.0000 mg | ORAL_TABLET | Freq: Every day | ORAL | Status: DC

## 2014-09-07 MED ORDER — LIVING WELL WITH DIABETES BOOK
Freq: Once | Status: AC
  Administered 2014-09-07: 10:00:00
  Filled 2014-09-07: qty 1

## 2014-09-07 MED ORDER — INSULIN DETEMIR 100 UNIT/ML ~~LOC~~ SOLN: 13.0000 [IU] | Freq: Every day | SUBCUTANEOUS | Status: AC

## 2014-09-07 MED ORDER — INSULIN DETEMIR 100 UNIT/ML ~~LOC~~ SOLN
13.0000 [IU] | Freq: Every day | SUBCUTANEOUS | Status: DC
  Administered 2014-09-07 – 2014-09-08 (×2): 13 [IU] via SUBCUTANEOUS
  Filled 2014-09-07 (×6): qty 0.13

## 2014-09-07 MED ORDER — ATORVASTATIN CALCIUM 40 MG PO TABS
80.0000 mg | ORAL_TABLET | Freq: Every day | ORAL | Status: DC
  Administered 2014-09-07: 80 mg via ORAL
  Filled 2014-09-07: qty 2

## 2014-09-07 MED ORDER — ATORVASTATIN CALCIUM 10 MG PO TABS: 10.0000 mg | ORAL_TABLET | Freq: Every day | ORAL | Status: DC

## 2014-09-07 MED ORDER — GLIPIZIDE ER 10 MG PO TB24: 10.0000 mg | ORAL_TABLET | ORAL | Status: DC

## 2014-09-07 NOTE — Discharge Summary (Deleted)
Physician Discharge Summary  Ormand Senn AOZ:[id number] DOB: [birth year] DOA: 09/06/2014  PCP: Petra Kuba, MD  Admit date: 09/06/2014 Discharge date: 09/07/2014  Time spent: 40 minutes  Recommendations for Outpatient Follow-up:  1. Follow up PCP 1-2 weeks for evaluation of diabetes control and stroke symptoms   Discharge Diagnoses:  Principal Problem:   CVA (cerebral vascular accident) Active Problems:   Prostate cancer   Left-sided weakness   Hypertension   Diabetes   Chronic kidney disease   GERD (gastroesophageal reflux disease)   Hyponatremia   Non compliance with medical treatment   CVA (cerebral infarction)   Discharge Condition: stable  Diet recommendation: carb modified heart healthy  Filed Weights   09/06/14 1111  Weight: 88.451 kg (195 lb)    History of present illness:  Jim [name] is a very pleasant 64 y.o. male a past medical history that includes hypertension, diabetes, chronic kidney disease, GERD, noncompliance, prostate cancer status post radiation therapy presented to the emergency department on 09/06/14 with the chief complaint gradual onset and worsening of left-sided weakness. Initial evaluation revealed hyperglycemia with a serum glucose of 501 and was concerning for CVA.  He reported that about 5 days prior he "ran out of diabetes medicine". About 3 days prior started with some intermittent weakness and muscle cramping of the left leg particularly. Associated symptoms included cramping of the left hand, cramping of the left leg, dizziness, left lower extremity weakness, and some slow slurred speech. the morning of admission he awakened and "my leg wouldn't work" so he decided come to the emergency room.   He denied chest pain palpitations headache visual disturbances numbness or tingling. He denied difficulty swallowing diaphoresis shortness of breath abdominal pain. He denied dysuria hematuria frequency or urgency.  Workup included  comprehensive metabolic panel significant for sodium of 133 chloride 94 BUN 30 creatinine 1.91 serum glucose 501, complete blood counts significant for hemoglobin of 11.8. CT of the head revealed atrophy with small vessel chronic ischemic changes as well as old lacunar infarct left external capsule and no acute intracranial abnormalities. He was hemodynamically stable afebrile and not hypoxic. In the emergency department he was given 12 units of NovoLog insulin as well as 2 L of normal saline.  Hospital Course:  CVA (cerebral vascular accident): Risk factors include family medical history hypertension uncontrolled diabetes.  MRI/MRA acute right pontine infarct,  2-D echo with mild LVH EF 60% and grade 1 diastolic dysfunction, carotid Dopplers with no significant stenosis. Hemoglobin A1c 13 and lipid panel with cholesterol 215, triglyceride 323 HDL 31 LDL 119.  He was already on aspirin so he was switched to Plavix and start a statin. Evaluated by  PT and OT who recommend OP follow up and rolling walker. Diabetes coordinator consult for education and recommendations. Pt educated to importance of diabetes control.  Active Problems:  Diabetes with hyperglycemia; patient admited to noncompliance. Serum glucose 501 on admission. Hg A1c 13. He was discharged with Levemir added to oral agents. Recommend close follow up for optimal control   Chronic kidney disease: Likely stage 3-4. Difficult to know what his baseline creatinine is lab work in chart more than 64 years old and at that time his creatinine was within the limits of normal. Creatinine on admission 1.9. Likely related to DM and HTN. Close OP follow up with PCP  Hypertension; Home meds held initially for  allow for permissive hypertension.    GERD (gastroesophageal reflux disease): remained stable at baseline     Non  compliance with medical treatment: educated as noted above    Procedures:  ehchoof mild LVH. Systolic function was  normal. The estimated ejection fraction was in the range of 60% to 65%. Wall motion was normal; there were no regional wall motion abnormalities. Doppler parameters are consistent with abnormal left ventricular relaxation (grade 1 diastolic dysfunction).  Consultations:  none  Discharge Exam: Filed Vitals:   09/07/14 1035  BP: 157/76  Pulse: 74  Temp: 98 F (36.7 C)  Resp: 18    General: well nourished appears comfortable Cardiovascular: rrr no MGR No LE edema Respiratory: normal effort BS clear Neuro: speech clear facial symmetry  Discharge Instructions    Current Discharge Medication List    START taking these medications   Details  atorvastatin (LIPITOR) 10 MG tablet Take 1 tablet (10 mg total) by mouth daily at 6 PM. Qty: 30 tablet, Refills: 1    clopidogrel (PLAVIX) 75 MG tablet Take 1 tablet (75 mg total) by mouth daily. Qty: 30 tablet, Refills: 1    insulin detemir (LEVEMIR) 100 UNIT/ML injection Inject 0.13 mLs (13 Units total) into the skin daily. Qty: 10 mL, Refills: 11      CONTINUE these medications which have CHANGED   Details  glipiZIDE (GLUCOTROL XL) 10 MG 24 hr tablet Take 1 tablet (10 mg total) by mouth every morning. Qty: 30 tablet, Refills: 1      CONTINUE these medications which have NOT CHANGED   Details  amLODipine (NORVASC) 10 MG tablet Take 10 mg by mouth every morning.    metFORMIN (GLUCOPHAGE-XR) 500 MG 24 hr tablet Take 1,000 mg by mouth 2 (two) times daily.     metoprolol succinate (TOPROL-XL) 50 MG 24 hr tablet Take 1 tablet by mouth daily. Refills: 0    naproxen sodium (ANAPROX) 220 MG tablet Take 220 mg by mouth 2 (two) times daily as needed (pain).      STOP taking these medications     aspirin EC 81 MG tablet      insulin NPH (HUMULIN N,NOVOLIN N) 100 UNIT/ML injection      losartan-hydrochlorothiazide (HYZAAR) 100-25 MG per tablet      sennosides-docusate sodium (SENOKOT-S) 8.6-50 MG tablet        No  Known Allergies Follow-up Information    Follow up with Petra Kuba, MD. Schedule an appointment as soon as possible for a visit in 1 week.   Specialty:  Family Medicine       The results of significant diagnostics from this hospitalization (including imaging, microbiology, ancillary and laboratory) are listed below for reference.    Significant Diagnostic Studies: Dg Chest 2 View  09/06/2014   CLINICAL DATA:  Left-sided weakness ; hypertension  EXAM: CHEST  2 VIEW  COMPARISON:  July 09, 2011  FINDINGS: There is no edema or consolidation. Heart size and pulmonary vascularity are normal. No adenopathy. There is degenerative change in the thoracic spine.  IMPRESSION: No edema or consolidation.   Electronically Signed   By: Lowella Grip III M.D.   On: 09/06/2014 17:07   Ct Head Wo Contrast  09/06/2014   CLINICAL DATA:  LEFT side weakness for 3 days, slurred speech since yesterday, history diabetes, hypertension, prostate cancer post radiation therapy, chronic kidney disease  EXAM: CT HEAD WITHOUT CONTRAST  TECHNIQUE: Contiguous axial images were obtained from the base of the skull through the vertex without intravenous contrast.  COMPARISON:  10/05/2010  FINDINGS: Generalized atrophy.  Normal ventricular morphology.  No midline shift  or mass effect.  Small vessel chronic ischemic changes of deep cerebral white matter.  Old appearing LEFT external capsule lacunar infarct.  No intracranial hemorrhage, mass lesion, or acute infarction.  Visualized paranasal sinuses and mastoid air cells clear.  Small calvarial exostosis LEFT frontal bone unchanged.  Bones otherwise unremarkable.  Atherosclerotic calcifications within internal carotid and vertebral arteries at skullbase.  IMPRESSION: Atrophy with small vessel chronic ischemic changes of deep cerebral white matter.  Old lacunar infarct LEFT external capsule.  No acute intracranial abnormalities.   Electronically Signed   By: Lavonia Dana M.D.    On: 09/06/2014 11:49   Mr Brain Wo Contrast  09/06/2014   CLINICAL DATA:  Left-sided weakness beginning 3 days ago. Speech disturbance today.  EXAM: MRI HEAD WITHOUT CONTRAST  MRA HEAD WITHOUT CONTRAST  TECHNIQUE: Multiplanar, multiecho pulse sequences of the brain and surrounding structures were obtained without intravenous contrast. Angiographic images of the head were obtained using MRA technique without contrast.  COMPARISON:  Head CT 09/06/2014  FINDINGS: MRI HEAD FINDINGS  There are a few subcentimeter foci of acute infarction in the right pons. A focus of chronic microhemorrhage is noted in the right frontal operculum. There is no evidence of mass, midline shift, or extra-axial fluid collection. There is mild generalized cerebral atrophy. Patchy and confluent T2 hyperintensities in the periventricular greater than subcortical cerebral white matter are advanced for age and nonspecific but compatible with moderate chronic small vessel ischemic disease. Chronic left basal ganglia lacunar infarct is noted.  Prior bilateral cataract extraction is noted. Paranasal sinuses and mastoid air cells are clear. Major intracranial vascular flow voids are preserved.  MRA HEAD FINDINGS  Decreased intensity of flow related enhancement in the distal V3 and proximal V4 segments is likely artifactual. The left vertebral artery is hypoplastic and ends in PICA. The right vertebral artery is dominant and supplies the basilar. Right PICA origin is patent. There is atherosclerotic irregularity with areas of mild to moderate narrowing involving the distal right vertebral artery and entire basilar artery. The left SCA origin is patent. Right SCA origin appears patent, however a severe proximal right SCA stenosis is suspected. There is a fetal origin of the right PCA. A patent left posterior communicating artery is also present. There is a severe proximal left P2 stenosis. There is also a severe mid right P2 stenosis.  Internal  carotid arteries are patent from skullbase to carotid termini. There is mild narrowing of the proximal left cavernous and left supraclinoid ICA. There is a mild-to-moderate proximal left A1 stenosis. ACAs are otherwise unremarkable. MCAs are patent without significant stenosis. No intracranial aneurysm is identified.  IMPRESSION: 1. Acute right pontine infarcts. 2. Moderate chronic small vessel ischemic disease and chronic left basal ganglia lacunar infarct. 3. No major intracranial arterial occlusion. Advanced posterior circulation atherosclerosis with moderate distal right vertebral and basilar artery stenosis and severe bilateral P2 PCA stenoses. 4. Mild left ICA and mild-to-moderate proximal left ACA stenoses.   Electronically Signed   By: Logan Bores   On: 09/06/2014 18:05   US Carotid Bilateral  09/06/2014   CLINICAL DATA:  64 year old male with a history of left-sided weakness for 3 days.  Cardiovascular risk factors include hypertension, prior stroke/ TIA, diabetes  EXAM: BILATERAL CAROTID DUPLEX ULTRASOUND  TECHNIQUE: Pearline Cables scale imaging, color Doppler and duplex ultrasound were performed of bilateral carotid and vertebral arteries in the neck.  COMPARISON:  None  FINDINGS: Criteria: Quantification of carotid stenosis is based on velocity parameters  that correlate the residual internal carotid diameter with NASCET-based stenosis levels, using the diameter of the distal internal carotid lumen as the denominator for stenosis measurement.  The following velocity measurements were obtained:  RIGHT  ICA:  Systolic 73 cm/sec, Diastolic 22 cm/sec  CCA:  93 cm/sec  SYSTOLIC ICA/CCA RATIO:  0.8  ECA:  83 cm/sec  LEFT  ICA:  Systolic 70 cm/sec, Diastolic 15 cm/sec  CCA:  124 cm/sec  SYSTOLIC ICA/CCA RATIO:  0.5  ECA:  109 cm/sec  Right Brachial SBP: Not acquired  Left Brachial SBP: Not acquired  RIGHT CAROTID ARTERY: Mild atherosclerotic changes of the right common carotid artery. Intermediate waveform maintained.  Heterogeneous plaque at the right carotid bifurcation without significant calcifications. Low resistance waveform of the right ICA.  RIGHT VERTEBRAL ARTERY: Antegrade flow with low resistance waveform.  LEFT CAROTID ARTERY: Mild atherosclerotic changes of the left common carotid artery. Intermediate waveform maintained. Heterogeneous plaque at the left carotid bifurcation without significant calcifications. Low resistance waveform of the left ICA.  LEFT VERTEBRAL ARTERY:  Antegrade flow with low resistance waveform.  IMPRESSION: Color duplex indicates minimal heterogeneous plaque, with no hemodynamically significant stenosis by duplex criteria in the extracranial cerebrovascular circulation.  Signed,  Dulcy Fanny. Earleen Newport, DO  [facility]  [facility]   Electronically Signed   By: Corrie Mckusick D.O.   On: 09/06/2014 17:27   Mr Jodene Nam Head/brain Wo Cm  09/06/2014   CLINICAL DATA:  Left-sided weakness beginning 3 days ago. Speech disturbance today.  EXAM: MRI HEAD WITHOUT CONTRAST  MRA HEAD WITHOUT CONTRAST  TECHNIQUE: Multiplanar, multiecho pulse sequences of the brain and surrounding structures were obtained without intravenous contrast. Angiographic images of the head were obtained using MRA technique without contrast.  COMPARISON:  Head CT 09/06/2014  FINDINGS: MRI HEAD FINDINGS  There are a few subcentimeter foci of acute infarction in the right pons. A focus of chronic microhemorrhage is noted in the right frontal operculum. There is no evidence of mass, midline shift, or extra-axial fluid collection. There is mild generalized cerebral atrophy. Patchy and confluent T2 hyperintensities in the periventricular greater than subcortical cerebral white matter are advanced for age and nonspecific but compatible with moderate chronic small vessel ischemic disease. Chronic left basal ganglia lacunar infarct is noted.  Prior bilateral cataract extraction is noted. Paranasal  sinuses and mastoid air cells are clear. Major intracranial vascular flow voids are preserved.  MRA HEAD FINDINGS  Decreased intensity of flow related enhancement in the distal V3 and proximal V4 segments is likely artifactual. The left vertebral artery is hypoplastic and ends in PICA. The right vertebral artery is dominant and supplies the basilar. Right PICA origin is patent. There is atherosclerotic irregularity with areas of mild to moderate narrowing involving the distal right vertebral artery and entire basilar artery. The left SCA origin is patent. Right SCA origin appears patent, however a severe proximal right SCA stenosis is suspected. There is a fetal origin of the right PCA. A patent left posterior communicating artery is also present. There is a severe proximal left P2 stenosis. There is also a severe mid right P2 stenosis.  Internal carotid arteries are patent from skullbase to carotid termini. There is mild narrowing of the proximal left cavernous and left supraclinoid ICA. There is a mild-to-moderate proximal left A1 stenosis. ACAs are otherwise unremarkable. MCAs are patent without significant stenosis. No intracranial aneurysm is identified.  IMPRESSION: 1. Acute right pontine infarcts. 2. Moderate chronic small vessel  ischemic disease and chronic left basal ganglia lacunar infarct. 3. No major intracranial arterial occlusion. Advanced posterior circulation atherosclerosis with moderate distal right vertebral and basilar artery stenosis and severe bilateral P2 PCA stenoses. 4. Mild left ICA and mild-to-moderate proximal left ACA stenoses.   Electronically Signed   By: Logan Bores   On: 09/06/2014 18:05    Microbiology: No results found for this or any previous visit (from the past 240 hour(s)).   Labs: Basic Metabolic Panel:  Recent Labs Lab 09/06/14 1201  NA 133*  K 4.1  CL 94*  CO2 29  GLUCOSE 501*  BUN 30*  CREATININE 1.91*  CALCIUM 9.7   Liver Function Tests: No results  for input(s): AST, ALT, ALKPHOS, BILITOT, PROT, ALBUMIN in the last 168 hours. No results for input(s): LIPASE, AMYLASE in the last 168 hours. No results for input(s): AMMONIA in the last 168 hours. CBC:  Recent Labs Lab 09/06/14 1201  WBC 6.9  NEUTROABS 5.5  HGB 11.8*  HCT 35.7*  MCV 76.9*  PLT 225   Cardiac Enzymes: No results for input(s): CKTOTAL, CKMB, CKMBINDEX, TROPONINI in the last 168 hours. BNP: BNP (last 3 results) No results for input(s): BNP in the last 8760 hours.  ProBNP (last 3 results) No results for input(s): PROBNP in the last 8760 hours.  CBG:  Recent Labs Lab 09/06/14 1435 09/06/14 1609 09/06/14 2107 09/07/14 0718 09/07/14 1131  GLUCAP 340* 272* 183* 243* 241*       Signed:  BLACK,KAREN M  [facility] 09/07/2014, 1:25 PM

## 2014-09-07 NOTE — Progress Notes (Signed)
[facility] PROGRESS NOTE  Jim [name] FHL:[id number] DOB: 1950/06/24 DOA: 09/06/2014 PCP: Petra Kuba, MD  Assessment/Plan: CVA (cerebral vascular accident): Risk factors include family medical history hypertension uncontrolled diabetes. MRI/MRA acute right pontine infarct as well as right vertebral and arterial stenosis and severe bilateral P2 PCA stenosis and Mild left ICA and mild-to-moderate proximal left ACA stenoses. 2-D echo with mild LVH EF 60% and grade 1 diastolic dysfunction, carotid Dopplers with no significant stenosis. Hemoglobin A1c 13 and lipid panel with cholesterol 215, triglyceride 323 HDL 31 LDL 119. He was already on aspirin so he was switched to Plavix and start a statin. Evaluated by PT and OT who recommend OP follow up and rolling walker. Diabetes coordinator consult for education and recommendations. Pt educated to importance of diabetes control. Will request neurology consult for recommendations on MRA/MRI results.  Active Problems:  Diabetes with hyperglycemia; patient admited to noncompliance. Serum glucose 501 on admission. Hg A1c 13. He was discharged with Levemir added to oral agents. Recommend close follow up for optimal control   Chronic kidney disease: Likely stage 3-4. Difficult to know what his baseline creatinine is lab work in chart more than 64 years old and at that time his creatinine was within the limits of normal. Creatinine on admission 1.9. Likely related to DM and HTN. Will check in am.  Hypertension; Home meds held for allow for permissive hypertension. Currently high end of normal.   GERD (gastroesophageal reflux disease): remained stable at baseline     Non compliance with medical treatment: educated as noted above   Code Status: full Family Communication:  Disposition Plan: home hopefully tomorrow   Consultants:  none  Procedures:  none  Antibiotics:  none  HPI/Subjective: Reports left leg feels  better/stronger  Objective: Filed Vitals:   09/07/14 1035  BP: 157/76  Pulse: 74  Temp: 98 F (36.7 C)  Resp: 18    Intake/Output Summary (Last 24 hours) at 09/07/14 1411 Last data filed at 09/07/14 1100  Gross per 24 hour  Intake    840 ml  Output      0 ml  Net    840 ml   Filed Weights   09/06/14 1111  Weight: 88.451 kg (195 lb)    Exam:   General:  Well nourished NAD  Cardiovascular: rrr no MGR no LE edema  Respiratory: normal effort BS clear bilaterally  Abdomen: soft non-distended +BS  Musculoskeletal: no clubbing or cyanosis  Neuro: speech slow but clear than on admission. Bilateral UE strenght 5/5. LE strength 4/5 on left and 5/5 on right   Data Reviewed: Basic Metabolic Panel:  Recent Labs Lab 09/06/14 1201  NA 133*  K 4.1  CL 94*  CO2 29  GLUCOSE 501*  BUN 30*  CREATININE 1.91*  CALCIUM 9.7   Liver Function Tests: No results for input(s): AST, ALT, ALKPHOS, BILITOT, PROT, ALBUMIN in the last 168 hours. No results for input(s): LIPASE, AMYLASE in the last 168 hours. No results for input(s): AMMONIA in the last 168 hours. CBC:  Recent Labs Lab 09/06/14 1201  WBC 6.9  NEUTROABS 5.5  HGB 11.8*  HCT 35.7*  MCV 76.9*  PLT 225   Cardiac Enzymes: No results for input(s): CKTOTAL, CKMB, CKMBINDEX, TROPONINI in the last 168 hours. BNP (last 3 results) No results for input(s): BNP in the last 8760 hours.  ProBNP (last 3 results) No results for input(s): PROBNP in the last 8760 hours.  CBG:  Recent Labs Lab 09/06/14 1435  09/06/14 1609 09/06/14 2107 09/07/14 0718 09/07/14 1131  GLUCAP 340* 272* 183* 243* 241*    No results found for this or any previous visit (from the past 240 hour(s)).   Studies: Dg Chest 2 View  09/06/2014   CLINICAL DATA:  Left-sided weakness ; hypertension  EXAM: CHEST  2 VIEW  COMPARISON:  July 09, 2011  FINDINGS: There is no edema or consolidation. Heart size and pulmonary vascularity are normal. No  adenopathy. There is degenerative change in the thoracic spine.  IMPRESSION: No edema or consolidation.   Electronically Signed   By: Lowella Grip III M.D.   On: 09/06/2014 17:07   Ct Head Wo Contrast  09/06/2014   CLINICAL DATA:  LEFT side weakness for 3 days, slurred speech since yesterday, history diabetes, hypertension, prostate cancer post radiation therapy, chronic kidney disease  EXAM: CT HEAD WITHOUT CONTRAST  TECHNIQUE: Contiguous axial images were obtained from the base of the skull through the vertex without intravenous contrast.  COMPARISON:  10/05/2010  FINDINGS: Generalized atrophy.  Normal ventricular morphology.  No midline shift or mass effect.  Small vessel chronic ischemic changes of deep cerebral white matter.  Old appearing LEFT external capsule lacunar infarct.  No intracranial hemorrhage, mass lesion, or acute infarction.  Visualized paranasal sinuses and mastoid air cells clear.  Small calvarial exostosis LEFT frontal bone unchanged.  Bones otherwise unremarkable.  Atherosclerotic calcifications within internal carotid and vertebral arteries at skullbase.  IMPRESSION: Atrophy with small vessel chronic ischemic changes of deep cerebral white matter.  Old lacunar infarct LEFT external capsule.  No acute intracranial abnormalities.   Electronically Signed   By: Lavonia Dana M.D.   On: 09/06/2014 11:49   Mr Brain Wo Contrast  09/06/2014   CLINICAL DATA:  Left-sided weakness beginning 3 days ago. Speech disturbance today.  EXAM: MRI HEAD WITHOUT CONTRAST  MRA HEAD WITHOUT CONTRAST  TECHNIQUE: Multiplanar, multiecho pulse sequences of the brain and surrounding structures were obtained without intravenous contrast. Angiographic images of the head were obtained using MRA technique without contrast.  COMPARISON:  Head CT 09/06/2014  FINDINGS: MRI HEAD FINDINGS  There are a few subcentimeter foci of acute infarction in the right pons. A focus of chronic microhemorrhage is noted in the right  frontal operculum. There is no evidence of mass, midline shift, or extra-axial fluid collection. There is mild generalized cerebral atrophy. Patchy and confluent T2 hyperintensities in the periventricular greater than subcortical cerebral white matter are advanced for age and nonspecific but compatible with moderate chronic small vessel ischemic disease. Chronic left basal ganglia lacunar infarct is noted.  Prior bilateral cataract extraction is noted. Paranasal sinuses and mastoid air cells are clear. Major intracranial vascular flow voids are preserved.  MRA HEAD FINDINGS  Decreased intensity of flow related enhancement in the distal V3 and proximal V4 segments is likely artifactual. The left vertebral artery is hypoplastic and ends in PICA. The right vertebral artery is dominant and supplies the basilar. Right PICA origin is patent. There is atherosclerotic irregularity with areas of mild to moderate narrowing involving the distal right vertebral artery and entire basilar artery. The left SCA origin is patent. Right SCA origin appears patent, however a severe proximal right SCA stenosis is suspected. There is a fetal origin of the right PCA. A patent left posterior communicating artery is also present. There is a severe proximal left P2 stenosis. There is also a severe mid right P2 stenosis.  Internal carotid arteries are patent from skullbase to  carotid termini. There is mild narrowing of the proximal left cavernous and left supraclinoid ICA. There is a mild-to-moderate proximal left A1 stenosis. ACAs are otherwise unremarkable. MCAs are patent without significant stenosis. No intracranial aneurysm is identified.  IMPRESSION: 1. Acute right pontine infarcts. 2. Moderate chronic small vessel ischemic disease and chronic left basal ganglia lacunar infarct. 3. No major intracranial arterial occlusion. Advanced posterior circulation atherosclerosis with moderate distal right vertebral and basilar artery stenosis and  severe bilateral P2 PCA stenoses. 4. Mild left ICA and mild-to-moderate proximal left ACA stenoses.   Electronically Signed   By: Logan Bores   On: 09/06/2014 18:05   US Carotid Bilateral  09/06/2014   CLINICAL DATA:  64 year old male with a history of left-sided weakness for 3 days.  Cardiovascular risk factors include hypertension, prior stroke/ TIA, diabetes  EXAM: BILATERAL CAROTID DUPLEX ULTRASOUND  TECHNIQUE: Pearline Cables scale imaging, color Doppler and duplex ultrasound were performed of bilateral carotid and vertebral arteries in the neck.  COMPARISON:  None  FINDINGS: Criteria: Quantification of carotid stenosis is based on velocity parameters that correlate the residual internal carotid diameter with NASCET-based stenosis levels, using the diameter of the distal internal carotid lumen as the denominator for stenosis measurement.  The following velocity measurements were obtained:  RIGHT  ICA:  Systolic 73 cm/sec, Diastolic 22 cm/sec  CCA:  93 cm/sec  SYSTOLIC ICA/CCA RATIO:  0.8  ECA:  83 cm/sec  LEFT  ICA:  Systolic 70 cm/sec, Diastolic 15 cm/sec  CCA:  786 cm/sec  SYSTOLIC ICA/CCA RATIO:  0.5  ECA:  109 cm/sec  Right Brachial SBP: Not acquired  Left Brachial SBP: Not acquired  RIGHT CAROTID ARTERY: Mild atherosclerotic changes of the right common carotid artery. Intermediate waveform maintained. Heterogeneous plaque at the right carotid bifurcation without significant calcifications. Low resistance waveform of the right ICA.  RIGHT VERTEBRAL ARTERY: Antegrade flow with low resistance waveform.  LEFT CAROTID ARTERY: Mild atherosclerotic changes of the left common carotid artery. Intermediate waveform maintained. Heterogeneous plaque at the left carotid bifurcation without significant calcifications. Low resistance waveform of the left ICA.  LEFT VERTEBRAL ARTERY:  Antegrade flow with low resistance waveform.  IMPRESSION: Color duplex indicates minimal heterogeneous plaque, with no hemodynamically significant  stenosis by duplex criteria in the extracranial cerebrovascular circulation.  Signed,  Dulcy Fanny. Earleen Newport, DO  [facility]  [facility]   Electronically Signed   By: Corrie Mckusick D.O.   On: 09/06/2014 17:27   Mr Jodene Nam Head/brain Wo Cm  09/06/2014   CLINICAL DATA:  Left-sided weakness beginning 3 days ago. Speech disturbance today.  EXAM: MRI HEAD WITHOUT CONTRAST  MRA HEAD WITHOUT CONTRAST  TECHNIQUE: Multiplanar, multiecho pulse sequences of the brain and surrounding structures were obtained without intravenous contrast. Angiographic images of the head were obtained using MRA technique without contrast.  COMPARISON:  Head CT 09/06/2014  FINDINGS: MRI HEAD FINDINGS  There are a few subcentimeter foci of acute infarction in the right pons. A focus of chronic microhemorrhage is noted in the right frontal operculum. There is no evidence of mass, midline shift, or extra-axial fluid collection. There is mild generalized cerebral atrophy. Patchy and confluent T2 hyperintensities in the periventricular greater than subcortical cerebral white matter are advanced for age and nonspecific but compatible with moderate chronic small vessel ischemic disease. Chronic left basal ganglia lacunar infarct is noted.  Prior bilateral cataract extraction is noted. Paranasal sinuses and mastoid air cells are clear. Major intracranial vascular flow  voids are preserved.  MRA HEAD FINDINGS  Decreased intensity of flow related enhancement in the distal V3 and proximal V4 segments is likely artifactual. The left vertebral artery is hypoplastic and ends in PICA. The right vertebral artery is dominant and supplies the basilar. Right PICA origin is patent. There is atherosclerotic irregularity with areas of mild to moderate narrowing involving the distal right vertebral artery and entire basilar artery. The left SCA origin is patent. Right SCA origin appears patent, however a severe proximal right SCA  stenosis is suspected. There is a fetal origin of the right PCA. A patent left posterior communicating artery is also present. There is a severe proximal left P2 stenosis. There is also a severe mid right P2 stenosis.  Internal carotid arteries are patent from skullbase to carotid termini. There is mild narrowing of the proximal left cavernous and left supraclinoid ICA. There is a mild-to-moderate proximal left A1 stenosis. ACAs are otherwise unremarkable. MCAs are patent without significant stenosis. No intracranial aneurysm is identified.  IMPRESSION: 1. Acute right pontine infarcts. 2. Moderate chronic small vessel ischemic disease and chronic left basal ganglia lacunar infarct. 3. No major intracranial arterial occlusion. Advanced posterior circulation atherosclerosis with moderate distal right vertebral and basilar artery stenosis and severe bilateral P2 PCA stenoses. 4. Mild left ICA and mild-to-moderate proximal left ACA stenoses.   Electronically Signed   By: Logan Bores   On: 09/06/2014 18:05    Scheduled Meds: . atorvastatin  10 mg Oral q1800  . clopidogrel  75 mg Oral Daily  . enoxaparin (LOVENOX) injection  40 mg Subcutaneous Q24H  . insulin aspart  0-15 Units Subcutaneous TID WC  . insulin aspart  0-5 Units Subcutaneous QHS  . insulin detemir  13 Units Subcutaneous Daily   Continuous Infusions: . sodium chloride 100 mL/hr at 09/07/14 0402    Principal Problem:   CVA (cerebral vascular accident) Active Problems:   Prostate cancer   Left-sided weakness   Hypertension   Diabetes   Chronic kidney disease   GERD (gastroesophageal reflux disease)   Hyponatremia   Non compliance with medical treatment   CVA (cerebral infarction)    Time spent: [facility] Pager [phone]. If 7PM-7AM, please contact night-coverage at [email or web address], password [facility] 09/07/2014, 2:11 PM  LOS: 1 day

## 2014-09-07 NOTE — Evaluation (Signed)
Physical Therapy Evaluation Patient Details Name: Jim [name] MRN: [id number] DOB: [birth year] Today's Date: 09/07/2014   History of Present Illness   Pt is a very pleasant 64 y.o. male a past medical history that includes hypertension, diabetes, chronic kidney disease, GERD, noncompliance, prostate cancer status post radiation therapy presents to the emergency department with the chief complaint gradual onset and worsening of left-sided weakness. Initial evaluation reveals hyperglycemia with a serum glucose of 501 and is concerning for CVA.  MRI reveals a right pontine stroke.  Clinical Impression  Pt is seen for evaluation.  He is married and is normally independent at home with no assistive device.  He has had some slurred speech but wife states that it is improved today from yesterday.  He is found to have weakness and decreased coordination in the LLE (4-/5).  He has a score of 40 on Berg Balance test.  He was instructed in gait with a cane and with a rolling walker.  While he was able to ambulate with a cane, his gait was mildly unstable.  With a rolling walker, gait was WNL.  I have recommended that he use a walker for now and hopefully will progress to a cane.  He was instructed in practicing single leg stance at home and have recommended OP PT at d/c.  Pt and wife would like to come to rehab at [facility].    Follow Up Recommendations Outpatient PT    Equipment Recommendations  Rolling walker with 5" wheels    Recommendations for Other Services   OT    Precautions / Restrictions Precautions Precautions: Fall Restrictions Weight Bearing Restrictions: No      Mobility  Bed Mobility Overal bed mobility: Independent Bed Mobility: Supine to Sit     Supine to sit: Independent        Transfers Overall transfer level: Independent Equipment used: None                Ambulation/Gait Ambulation/Gait assistance: Min guard Ambulation Distance (Feet): 250 Feet Assistive  device: Rolling walker (2 wheeled);Straight cane Gait Pattern/deviations: Decreased step length - left;Decreased stance time - left;Decreased dorsiflexion - left Gait velocity: the above is noted for gait with a cane...gait with a walker is much more stable with no signifcant gait deviations Gait velocity interpretation: at or above normal speed for age/gender General Gait Details: gait at normal speed with a walker  Stairs Stairs: Yes Stairs assistance: Min guard Stair Management: Backwards;No rails;With walker Number of Stairs: 1        Modified Rankin (Stroke Patients Only) Modified Rankin (Stroke Patients Only) Pre-Morbid Rankin Score: No symptoms Modified Rankin: Moderate disability     Balance                                 Standardized Balance Assessment Standardized Balance Assessment : Berg Balance Test Berg Balance Test Sit to Stand: Able to stand  independently using hands Standing Unsupported: Able to stand safely 2 minutes Sitting with Back Unsupported but Feet Supported on Floor or Stool: Able to sit safely and securely 2 minutes Stand to Sit: Sits safely with minimal use of hands Transfers: Able to transfer safely, definite need of hands Standing Unsupported with Eyes Closed: Able to stand 10 seconds safely Standing Ubsupported with Feet Together: Able to place feet together independently and stand 1 minute safely From Standing, Reach Forward with Outstretched Arm: Can reach forward >12 cm  safely (5") From Standing Position, Pick up Object from Floor: Able to pick up shoe, needs supervision From Standing Position, Turn to Look Behind Over each Shoulder: Looks behind one side only/other side shows less weight shift Turn 360 Degrees: Needs close supervision or verbal cueing Standing Unsupported, Alternately Place Feet on Step/Stool: Able to complete >2 steps/needs minimal assist Standing Unsupported, One Foot in Front: Able to take small step  independently and hold 30 seconds Standing on One Leg: Tries to lift leg/unable to hold 3 seconds but remains standing independently Total Score: 40         Pertinent Vitals/Pain Pain Assessment: No/denies pain    Home Living Family/patient expects to be discharged to:: Private residence Living Arrangements: Spouse/significant other Available Help at Discharge: Family;Available 24 hours/day Type of Home: House Home Access: Stairs to enter Entrance Stairs-Rails: None Entrance Stairs-Number of Steps: 1 Home Layout: One level Home Equipment: None      Prior Function Level of Independence: Independent         Comments: retired, likes to work in the yard     [place] Hand: Right    Extremity/Trunk Assessment   Upper Extremity Assessment: Defer to OT evaluation       LUE Deficits / Details: Pt has slight delay in extension of 2nd finger at DIP joint. DIP joint will pop upon extension, increased time and effort required to open 2nd finger   Lower Extremity Assessment: LLE deficits/detail   LLE Deficits / Details: strength 4-/5 in hip and knee, 3-/5 in ankle, decreased coordination  Cervical / Trunk Assessment: Normal  Communication   Communication: No difficulties  Cognition Arousal/Alertness: Awake/alert Behavior During Therapy: WFL for tasks assessed/performed Overall Cognitive Status: Within Functional Limits for tasks assessed                            Exercises Hand Exercises Digit Composite Flexion: PROM;5 reps;AROM;10 reps Composite Extension: PROM;5 reps;AROM;10 reps      Assessment/Plan    PT Assessment Patient needs continued PT services  PT Diagnosis Difficulty walking;Abnormality of gait;Hemiplegia non-dominant side (left hemiparesis)   PT Problem List Decreased strength;Decreased balance;Decreased coordination;Decreased knowledge of use of DME  PT Treatment Interventions DME instruction;Gait training;Therapeutic  exercise;Neuromuscular re-education   PT Goals (Current goals can be found in the Care Plan section) Acute Rehab PT Goals Patient Stated Goal: return to all activities independently PT Goal Formulation: With patient Time For Goal Achievement: 09/14/14 Potential to Achieve Goals: Good    Frequency Min 5X/week   Barriers to discharge   none                   End of Session Equipment Utilized During Treatment: Gait belt Activity Tolerance: Patient tolerated treatment well Patient left: in bed;with call bell/phone within reach      Functional Assessment Tool Used: clinical judgement Functional Limitation: Mobility: Walking and moving around Mobility: Walking and Moving Around Current Status (S5681): At least 20 percent but less than 40 percent impaired, limited or restricted Mobility: Walking and Moving Around Goal Status [phone]): At least 1 percent but less than 20 percent impaired, limited or restricted    Time: 0920-1005 PT Time Calculation (min) (ACUTE ONLY): 45 min   Charges:   PT Evaluation $Initial PT Evaluation Tier I: 1 Procedure     PT G Codes:   PT G-Codes **NOT FOR INPATIENT CLASS** Functional Assessment Tool Used: clinical judgement  Functional Limitation: Mobility: Walking and moving around Mobility: Walking and Moving Around Current Status [phone]): At least 20 percent but less than 40 percent impaired, limited or restricted Mobility: Walking and Moving Around Goal Status [phone]): At least 1 percent but less than 20 percent impaired, limited or restricted    Sable Feil 09/07/2014, 10:34 AM

## 2014-09-07 NOTE — Evaluation (Signed)
Occupational Therapy Evaluation Patient Details Name: Jim [name] MRN: [id number] DOB: Nov 01, 1950 Today's Date: 09/07/2014    History of Present Illness  Pt is a very pleasant 64 y.o. male a past medical history that includes hypertension, diabetes, chronic kidney disease, GERD, noncompliance, prostate cancer status post radiation therapy presents to the emergency department with the chief complaint gradual onset and worsening of left-sided weakness. Initial evaluation reveals hyperglycemia with a serum glucose of 501 and is concerning for CVA.   Clinical Impression   PTA pt lived at home with wife and was independent in all B/IADLs. Pt reports feeling weak on Monday and was trying to wait until Thursday to see his MD. Pt awake, alert, and oriented x4 this am. Pt has slightly slurred speech, however has no difficulties communicating. Pt able to perform bed mobility and LB dressing independently. Pt reports feeling weak in LUE, however demonstrates good strength of 4+/5, only slightly weaker than RUE which is 5/5. Pt has some difficulty with full extension of 2nd finger of left hand at DIP joint. DIP joint is delayed during extension and "pops" into extension. Pt was able to achieve full extension quicker upon completing composite flexion/extension 10x. Pt is at baseline with ADL tasks, recommend supervision during functional mobility activities, no further acute OT required.     Follow Up Recommendations  No OT follow up;Supervision - Intermittent    Equipment Recommendations  Tub/shower bench       Precautions / Restrictions Precautions Precautions: Fall Restrictions Weight Bearing Restrictions: No      Mobility Bed Mobility Overal bed mobility: Independent Bed Mobility: Supine to Sit     Supine to sit: Independent                ADL Overall ADL's : Modified independent                     Lower Body Dressing: Modified independent                  General ADL Comments: Pt is able to complete ADLs with mod independence, requiring extra time for safety and task completion.      Vision Vision Assessment?: Yes;No apparent visual deficits Eye Alignment: Within Functional Limits Ocular Range of Motion: Within Functional Limits Alignment/Gaze Preference: Within Defined Limits Tracking/Visual Pursuits: Able to track stimulus in all quads without difficulty Saccades: Within functional limits Convergence: Within functional limits Visual Fields: No apparent deficits          Pertinent Vitals/Pain Pain Assessment: No/denies pain     Hand Dominance Right   Extremity/Trunk Assessment Upper Extremity Assessment Upper Extremity Assessment: LUE deficits/detail LUE Deficits / Details: Pt has slight delay in extension of 2nd finger at DIP joint. DIP joint will pop upon extension, increased time and effort required to open 2nd finger   Lower Extremity Assessment Lower Extremity Assessment: Defer to PT evaluation       Communication Communication Communication: No difficulties   Cognition Arousal/Alertness: Awake/alert Behavior During Therapy: WFL for tasks assessed/performed Overall Cognitive Status: Within Functional Limits for tasks assessed                                Home Living Family/patient expects to be discharged to:: Private residence Living Arrangements: Spouse/significant other Available Help at Discharge: Family;Available 24 hours/day Type of Home: House             Bathroom  Shower/Tub: Occupational psychologist: None          Prior Functioning/Environment Level of Independence: Independent            Exercises  Exercises Hand exercises  Hand Exercises  Digit Composite Flexion PROM;5 reps;AROM;10 reps  Composite Extension PROM;5 reps;AROM;10 reps    OT Diagnosis: Generalized weakness   OT Problem List: Decreased strength    End of  Session    Activity Tolerance: Patient tolerated treatment well Patient left: in bed;with call bell/phone within reach;with nursing/sitter in room;with family/visitor present   Time: 0825-0840 OT Time Calculation (min): 15 min Charges:  OT General Charges $OT Visit: 1 Procedure OT Evaluation $Initial OT Evaluation Tier I: 1 Procedure G-Codes: OT G-codes **NOT FOR INPATIENT CLASS** Functional Assessment Tool Used: Clinical judgement Functional Limitation: Self care Self Care Goal Status (E6754): At least 1 percent but less than 20 percent impaired, limited or restricted Self Care Discharge Status [phone]): At least 1 percent but less than 20 percent impaired, limited or restricted   Guadelupe Sabin, OTR/L  [phone]  09/07/2014, 9:50 AM

## 2014-09-07 NOTE — Progress Notes (Addendum)
Inpatient Diabetes Program Recommendations  AACE/ADA: New Consensus Statement on Inpatient Glycemic Control (2013)  Target Ranges:  Prepandial:   less than 140 mg/dL      Peak postprandial:   less than 180 mg/dL (1-2 hours)      Critically ill patients:  140 - 180 mg/dL   Results for Jim [name], Jim [name] (MRN [id number]) as of 09/07/2014 08:01  Ref. Range 09/06/2014 14:35 09/06/2014 16:09 09/06/2014 21:07 09/07/2014 07:18  Glucose-Capillary Latest Ref Range: 70-99 mg/dL 340 (H) 272 (H) 183 (H) 243 (H)    Diabetes history: DM2 Outpatient Diabetes medications: Glipizide 10 mg QAM, Metformin 1000 mg BID, NPH 12 units QHS (according to note, pt ran out of oral DM meds about 5 days ago and has not taken insulin in over a year) Current orders for Inpatient glycemic control: Novolog 0-15 units TID with meals, Novolog 0-5 units HS  Inpatient Diabetes Program Recommendations Insulin - Basal: Please consider ordering low dose basal insulin; recommend starting with Levemir 13 units daily starting now (based on 86.6 kg x 0.15 units). HgbA1C: 10.5% on 09/06/14.  09/07/14@12 :80: Spoke with patient and his wife about diabetes and home regimen for diabetes control. Patient reports that he is followed by his PCP for diabetes management. Patient states that he ran out of DM medications about 5 days ago but he normally takes Glipizide 10 mg QAM and Metformin 1000 mg BID as an outpatient for diabetes control. Patient states that he was on insulin in the past but it was costing a lot of money out of pocket and his doctor took him off insulin since his diabetes was controlled. Patient reports that his last A1C was in the 7% range.  Discussed A1C results (10.5% on 09/06/14) and explained how the A1C correlates to actual glucose values, basic pathophysiology of DM Type 2, basic home care, importance of checking CBGs and maintaining good CBG control to prevent long-term and short-term complications. Discussed impact of nutrition,  exercise, stress, sickness, and medications on diabetes control.  Patient states he does not currently check his glucose but he has everything at home to check his glucose. Reviewed glucose and A1C goals and asked that he check his glucose 3-4 times per day. Instructed patient to keep a log of glucose readings which he needs to take with him to his follow up visits with his PCP so they can continue to make adjustments with his diabetes medications if needed. Reviewed how to administer insulin and discussed Levemir and Novolog insulin. Patient states that he feels comfortable and knowledgable about how to administer insulin with vial/syringe. Also reviewed and demonstrated how to use an insulin pen in case patient decided to use an insulin pen at some point. Patient states that he wants to be sure he can afford the copays for the insulin. Informed patient that since he has [employer] he can go online and apply for a Levemir savings card which would allow him to only pay $25 per month out of pocket. Patient states that he does not have a computer but he will go to [employer] and apply for it which he will print off and take to his pharmacy.  Reviewed signs and symptoms of hyperglycemia and hypoglycemia along with treatment for both. Patient expressed appreciation of information discussed and both he and his wife were very attentive and engaged throughout our conversation. Patient verbalized understanding of information discussed and he states that he has no further questions at this time related to diabetes.  RNs to  provide ongoing basic DM education at bedside with this patient and engage patient to actively check blood glucose and administer insulin injections.      Thanks, Barnie Alderman, RN, MSN, CCRN, CDE Diabetes Coordinator Inpatient Diabetes Program [phone] (Team Pager) [phone] ([facility] office) [phone] [facility] office)   Thanks, Barnie Alderman, RN, MSN, CCRN, CDE Diabetes Coordinator Inpatient Diabetes  Program [phone] (Team Pager from 8am to [place]) [phone] ([facility] office) [phone] [facility] office)

## 2014-09-07 NOTE — Consult Note (Signed)
Arvin A. Merlene Laughter, MD     [email or web address]          Jim [name] is an 64 y.o. male.   ASSESSMENT/PLAN: 1. Right pontine infarcts. Risk factors poorly controlled diabetes, hypertension and age. 2. 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 3. Remote left basal ganglia infarct. 4. 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  RECOMMENDATION: 1. Dual antiplatelet agent for 3-6 months. Subsequently, the patient should be placed on single aspirin therapy 325 mg. 2. Optimization of statin therapy. 3. Control of diabetes and hypertension. 4. Physical and occupational therapy. 5. Additional blood tests for homocysteine level, TSH, RPR and vitamin B12.   The patient is a 64 year old right-handed black male who reported awakening a week ago with left-sided numbness and heaviness. The patient did seek medical help immediately. He had an appointment with his primary care provider and decided to keep that appointment. However, the symptoms persisted and he also developed dysarthria which resulted the patient's he couldn't more urgent medical care. The patient tells me that he was supposed to be an aspirin 81 mg a day but discontinued this about 2 months ago for unknown reasons. The patient does not report swallowing problems. No headaches are reported. The right side is asymptomatic. Does not report having a previous infarct. He denies other symptoms such as chest pain, shortness of breath, GI GU symptoms. Review of systems is otherwise negative.   GENERAL: This very pleasant man who appears about 5 years or more than the stated age. He is in no acute distress.  HEENT: Supple. Atraumatic normocephalic.   ABDOMEN: soft  EXTREMITIES: No edema   BACK: Normal.  SKIN: Normal by inspection.    MENTAL STATUS: Alert and oriented. Speech, language and cognition are generally intact.  Judgment and insight normal. Month and age adjusted appropriately.  CRANIAL NERVES: Pupils are equal, round and reactive to light and accommodation; extra ocular movements are full, there is no significant nystagmus; visual fields are full; upper and lower facial muscles are normal in strength and symmetric, there is flattening of the nasolabial fold - L; tongue is midline; uvula is midline; shoulder elevation is normal.  MOTOR: Left upper extremity 4+/5. Mild left upper extremity pronator drift. Left leg 4+/5 with no drift. Right side shows normal tone, bulk and strength. No drift on the right side.  COORDINATION: Left finger to nose is normal, right finger to nose is normal, No rest tremor; no intention tremor; no postural tremor; no bradykinesia.  REFLEXES: Deep tendon reflexes are symmetrical and normal. Babinski reflexes are flexor bilaterally.   SENSATION: There is reduced sensation to light touch and temperature left upper and left lower extremity. There is no extinction to double simultaneous stimulation.  NIH stroke scale 3.     The brain MRI is reviewed in person. There are 3 small lacunar infarcts involving the right pontine region seen on diffusion imaging. There is a chronic infarct involving the putaminal region on the right side extending several cuts and extending to the corner radiata. There is severe confluent periventricular and deep white matter leukoencephalopathy indicating chronic microscopic ischemic changes. Brain MRA shows significant luminal irregularity of the basilar artery and left PCA. There is a  Dropout of the right PCA indicating significant stenosis. There is also significant dropout of the intracranial vertebral artery especially on the left side and the vertebral basilar junction.  Blood pressure 173/76, pulse 76, temperature 97.9 F (36.6 C), temperature source  Axillary, resp. rate 18, height _0  (1.727 m), weight 88.451 kg (195 lb), SpO2 100 %.  Past  Medical History  Diagnosis Date  . Hypertension   . Diabetes mellitus   . Chronic kidney disease     cyst on left kidney   . GERD (gastroesophageal reflux disease)   . Degenerative disc disease, lumbar   . Prostate cancer 06/11/12    gleason 7  . Hx of radiation therapy 12/13/12- 01/27/13    prostate bed 6600 cGy 33 sessions    Past Surgical History  Procedure Laterality Date  . Robot assisted laparoscopic radical prostatectomy  06/11/2012    Procedure: ROBOTIC ASSISTED LAPAROSCOPIC RADICAL PROSTATECTOMY;  Surgeon: Alexis Frock, MD;  Location: [facility];  Service: Urology;  Laterality: N/A;  1st procedure: Robotic Left Renal Cyst Decortication   . Lymphadenectomy  06/11/2012    Procedure: LYMPHADENECTOMY;  Surgeon: Alexis Frock, MD;  Location: [facility];  Service: Urology;  Laterality: Bilateral;  . Robotic assited partial nephrectomy  06/11/2012    Procedure: ROBOTIC ASSITED PARTIAL NEPHRECTOMY;  Surgeon: Alexis Frock, MD;  Location: [facility];  Service: Urology;  Laterality: Left;  . Prostate biopsy  03/25/2012    Family History  Problem Relation Age of Onset  . Cancer Mother     pancreatic  . Cancer Father     lung  . Cancer Brother     prostate-surgery 6 years ago    Social History:  reports that he has never smoked. He does not have any smokeless tobacco history on file. He reports that he does not drink alcohol or use illicit drugs.  Allergies: No Known Allergies  Medications: Prior to Admission medications   Medication Sig Start Date End Date Taking? Authorizing Provider  amLODipine (NORVASC) 10 MG tablet Take 10 mg by mouth every morning.   Yes Historical Provider, MD  aspirin EC 81 MG tablet Take 81 mg by mouth daily.   Yes Historical Provider, MD  insulin NPH (HUMULIN N,NOVOLIN N) 100 UNIT/ML injection Inject 12 Units into the skin at bedtime.    Yes Historical Provider, MD  losartan-hydrochlorothiazide (HYZAAR) 100-25 MG per tablet Take 1 tablet by mouth daily. 07/26/14   Yes Historical Provider, MD  metFORMIN (GLUCOPHAGE-XR) 500 MG 24 hr tablet Take 1,000 mg by mouth 2 (two) times daily.    Yes Historical Provider, MD  metoprolol succinate (TOPROL-XL) 50 MG 24 hr tablet Take 1 tablet by mouth daily. 07/26/14  Yes Historical Provider, MD  naproxen sodium (ANAPROX) 220 MG tablet Take 220 mg by mouth 2 (two) times daily as needed (pain).   Yes Historical Provider, MD  atorvastatin (LIPITOR) 10 MG tablet Take 1 tablet (10 mg total) by mouth daily at 6 PM. 09/07/14   Radene Gunning, NP  clopidogrel (PLAVIX) 75 MG tablet Take 1 tablet (75 mg total) by mouth daily. 09/07/14   Radene Gunning, NP  glipiZIDE (GLUCOTROL XL) 10 MG 24 hr tablet Take 1 tablet (10 mg total) by mouth every morning. 09/07/14   Radene Gunning, NP  insulin detemir (LEVEMIR) 100 UNIT/ML injection Inject 0.13 mLs (13 Units total) into the skin daily. 09/07/14   Radene Gunning, NP  sennosides-docusate sodium (SENOKOT-S) 8.6-50 MG tablet Take 1 tablet by mouth 2 (two) times daily. While taking pain meds to prevent constipation Patient not taking: Reported on 09/06/2014 06/13/12   Alexis Frock, MD    Scheduled Meds: . atorvastatin  80 mg Oral q1800  . clopidogrel  75  mg Oral Daily  . enoxaparin (LOVENOX) injection  40 mg Subcutaneous Q24H  . insulin aspart  0-15 Units Subcutaneous TID WC  . insulin aspart  0-5 Units Subcutaneous QHS  . insulin detemir  13 Units Subcutaneous Daily   Continuous Infusions: . sodium chloride 100 mL/hr at 09/07/14 0402   PRN Meds:.acetaminophen **OR** acetaminophen, senna-docusate     Results for orders placed or performed during the hospital encounter of 09/06/14 (from the past 48 hour(s))  CBC with Differential     Status: Abnormal   Collection Time: 09/06/14 12:01 PM  Result Value Ref Range   WBC 6.9 4.0 - 10.5 K/uL   RBC 4.64 4.22 - 5.81 MIL/uL   Hemoglobin 11.8 (L) 13.0 - 17.0 g/dL   HCT 35.7 (L) 39.0 - 52.0 %   MCV 76.9 (L) 78.0 - 100.0 fL   MCH 25.4 (L) 26.0 -  34.0 pg   MCHC 33.1 30.0 - 36.0 g/dL   RDW 12.8 11.5 - 15.5 %   Platelets 225 150 - 400 K/uL   Neutrophils Relative % 80 (H) 43 - 77 %   Neutro Abs 5.5 1.7 - 7.7 K/uL   Lymphocytes Relative 12 12 - 46 %   Lymphs Abs 0.8 0.7 - 4.0 K/uL   Monocytes Relative 5 3 - 12 %   Monocytes Absolute 0.3 0.1 - 1.0 K/uL   Eosinophils Relative 3 0 - 5 %   Eosinophils Absolute 0.2 0.0 - 0.7 K/uL   Basophils Relative 0 0 - 1 %   Basophils Absolute 0.0 0.0 - 0.1 K/uL  Basic metabolic panel     Status: Abnormal   Collection Time: 09/06/14 12:01 PM  Result Value Ref Range   Sodium 133 (L) 135 - 145 mmol/L   Potassium 4.1 3.5 - 5.1 mmol/L   Chloride 94 (L) 96 - 112 mmol/L   CO2 29 19 - 32 mmol/L   Glucose, Bld 501 (H) 70 - 99 mg/dL   BUN 30 (H) 6 - 23 mg/dL   Creatinine, Ser 1.91 (H) 0.50 - 1.35 mg/dL   Calcium 9.7 8.4 - 10.5 mg/dL   GFR calc non Af Amer 35 (L) >90 mL/min   GFR calc Af Amer 41 (L) >90 mL/min    Comment: (NOTE) The eGFR has been calculated using the CKD EPI equation. This calculation has not been validated in all clinical situations. eGFR's persistently <90 mL/min signify possible Chronic Kidney Disease.    Anion gap 10 5 - 15  Hemoglobin A1c     Status: Abnormal   Collection Time: 09/06/14 12:01 PM  Result Value Ref Range   Hgb A1c MFr Bld 10.5 (H) 4.8 - 5.6 %    Comment: (NOTE)         Pre-diabetes: 5.7 - 6.4         Diabetes: >6.4         Glycemic control for adults with diabetes: <7.0    Mean Plasma Glucose 255 mg/dL    Comment: (NOTE) Performed At: [facility] [place], [place] [id number] Lindon Romp MD ER:[id number]   Lipid panel     Status: Abnormal   Collection Time: 09/06/14 12:01 PM  Result Value Ref Range   Cholesterol 215 (H) 0 - 200 mg/dL   Triglycerides 323 (H) <150 mg/dL   HDL 31 (L) >39 mg/dL   Total CHOL/HDL Ratio 6.9 RATIO   VLDL 65 (H) 0 - 40 mg/dL   LDL Cholesterol 119 (H)  0 - 99 mg/dL    Comment:        Total  Cholesterol/HDL:CHD Risk Coronary Heart Disease Risk Table                     Men   Women  1/2 Average Risk   3.4   3.3  Average Risk       5.0   4.4  2 X Average Risk   9.6   7.1  3 X Average Risk  23.4   11.0        Use the calculated Patient Ratio above and the CHD Risk Table to determine the patient's CHD Risk.        ATP III CLASSIFICATION (LDL):  <100     mg/dL   Optimal  100-129  mg/dL   Near or Above                    Optimal  130-159  mg/dL   Borderline  160-189  mg/dL   High  >190     mg/dL   Very High   CBG monitoring, ED     Status: Abnormal   Collection Time: 09/06/14  2:35 PM  Result Value Ref Range   Glucose-Capillary 340 (H) 70 - 99 mg/dL  Glucose, capillary     Status: Abnormal   Collection Time: 09/06/14  4:09 PM  Result Value Ref Range   Glucose-Capillary 272 (H) 70 - 99 mg/dL   Comment 1 Notify RN    Comment 2 Document in Chart   Glucose, capillary     Status: Abnormal   Collection Time: 09/06/14  9:07 PM  Result Value Ref Range   Glucose-Capillary 183 (H) 70 - 99 mg/dL   Comment 1 Notify RN    Comment 2 Document in Chart   Glucose, capillary     Status: Abnormal   Collection Time: 09/07/14  7:18 AM  Result Value Ref Range   Glucose-Capillary 243 (H) 70 - 99 mg/dL   Comment 1 Notify RN    Comment 2 Document in Chart   Glucose, capillary     Status: Abnormal   Collection Time: 09/07/14 11:31 AM  Result Value Ref Range   Glucose-Capillary 241 (H) 70 - 99 mg/dL   Comment 1 Notify RN    Comment 2 Document in Chart   Glucose, capillary     Status: Abnormal   Collection Time: 09/07/14  4:17 PM  Result Value Ref Range   Glucose-Capillary 214 (H) 70 - 99 mg/dL   Comment 1 Notify RN     Studies/Results:  IMPRESSION: 1. Acute right pontine infarcts. 2. Moderate chronic small vessel ischemic disease and chronic left basal ganglia lacunar infarct. 3. No major intracranial arterial occlusion. Advanced posterior circulation atherosclerosis with  moderate distal right vertebral and basilar artery stenosis and severe bilateral P2 PCA stenoses. 4. Mild left ICA and mild-to-moderate proximal left ACA stenoses.   Anita Laguna A. Merlene Laughter, M.D.  Diplomate, Tax adviser of Psychiatry and Neurology ( Neurology). 09/07/2014, 6:21 PM

## 2014-09-07 NOTE — Progress Notes (Signed)
UR completed 

## 2014-09-07 NOTE — Care Management Note (Addendum)
    Page 1 of 1   09/08/2014     3:01:01 PM CARE MANAGEMENT NOTE 09/08/2014  Patient:  Jim [name], Jim [name]   Account Number:  [id number]  Date Initiated:  09/07/2014  Documentation initiated by:  Jolene Provost  Subjective/Objective Assessment:   Pt is from home, lives with wife. Pt has no HH services or DME's prior to admission. Pt having trouble affording insuling, pt is insured. PT/OT recommends walker, shower chair and OP PT.     Action/Plan:   Pt chooses [facility] for DME's and [facility] for [facility] PT. referral faxed to [facility]. Terrence Dupont, of [facility] notified of referral and will obtain pt info from chart. wheelchair delivered to pt room and shower chair delivered to pt's home.   Anticipated DC Date:  09/08/2014   Anticipated DC Plan:  [place]  CM consult      Choice offered to / List presented to:             Status of service:  Completed, signed off Medicare Important Message given?   (If response is "NO", the following Medicare IM given date fields will be blank) Date Medicare IM given:   Medicare IM given by:   Date Additional Medicare IM given:   Additional Medicare IM given by:    Discharge Disposition:  HOME/SELF CARE  Per UR Regulation:    If discussed at Long Length of Stay Meetings, dates discussed:    Comments:  09/08/2014 [place], RN, MSN,CM Pt discharging home today, no CM needs. 09/07/2014 [place], RN, MSN, CM Referral made to diabetes coordinator for insulin recommendations and affordability. No further CM needs.

## 2014-09-08 DIAGNOSIS — I639 Cerebral infarction, unspecified: Principal | ICD-10-CM

## 2014-09-08 LAB — GLUCOSE, CAPILLARY
GLUCOSE-CAPILLARY: 150 mg/dL — AB (ref 70–99)
Glucose-Capillary: 253 mg/dL — ABNORMAL HIGH (ref 70–99)

## 2014-09-08 LAB — BASIC METABOLIC PANEL
ANION GAP: 8 (ref 5–15)
BUN: 22 mg/dL (ref 6–23)
CHLORIDE: 105 mmol/L (ref 96–112)
CO2: 26 mmol/L (ref 19–32)
Calcium: 8.9 mg/dL (ref 8.4–10.5)
Creatinine, Ser: 1.53 mg/dL — ABNORMAL HIGH (ref 0.50–1.35)
GFR calc Af Amer: 53 mL/min — ABNORMAL LOW (ref >90)
GFR calc non Af Amer: 46 mL/min — ABNORMAL LOW (ref >90)
Glucose, Bld: 179 mg/dL — ABNORMAL HIGH (ref 70–99)
POTASSIUM: 3.7 mmol/L (ref 3.5–5.1)
SODIUM: 139 mmol/L (ref 135–145)

## 2014-09-08 LAB — TSH: TSH: 1.192 u[IU]/mL (ref 0.350–4.500)

## 2014-09-08 LAB — ANTITHROMBIN III: AntiThromb III Func: 138 % — ABNORMAL HIGH (ref 75–120)

## 2014-09-08 MED ORDER — METFORMIN HCL ER 500 MG PO TB24
1000.0000 mg | ORAL_TABLET | Freq: Two times a day (BID) | ORAL | Status: DC
Start: 2014-09-08 — End: 2014-10-14

## 2014-09-08 MED ORDER — METOPROLOL SUCCINATE ER 50 MG PO TB24: 50.0000 mg | ORAL_TABLET | Freq: Every day | ORAL | Status: AC

## 2014-09-08 MED ORDER — ATORVASTATIN CALCIUM 80 MG PO TABS: 80.0000 mg | ORAL_TABLET | Freq: Every day | ORAL | Status: AC

## 2014-09-08 MED ORDER — AMLODIPINE BESYLATE 10 MG PO TABS
10.0000 mg | ORAL_TABLET | Freq: Every morning | ORAL | Status: DC
Start: 2014-09-08 — End: 2016-01-21

## 2014-09-08 NOTE — Progress Notes (Signed)
Patient ID: Jim [name], male   DOB: [birth year], 64 y.o.   MRN: [id number]  Jim A. Merlene Laughter, MD     [email or web address]          Jim [name] is an 64 y.o. male.   Assessment/Plan: 1. Right pontine infarcts. Risk factors poorly controlled diabetes, hypertension and age. 2. 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 3. Remote left basal ganglia infarct. 4. 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  RECOMMENDATION: 1. Dual antiplatelet agent for 3-6 months. Subsequently, the patient should be placed on single aspirin therapy 325 mg. 2. Optimization of statin therapy. 3. Control of diabetes and hypertension. 4. Physical and occupational therapy. 5. Additional blood tests for homocysteine level, TSH, RPR and vitamin B12. 6. Follow-up in the office with [place] in 8 weeks.   The complaints are reported today. He reports being the same with his symptoms on the left side. His wife is present in the case is discussed with them both. Risk modifications discussed at length with the patient and his wife.  GENERAL: This very pleasant man who appears about 5 years or more than the stated age. He is in no acute distress.  HEENT: Supple. Atraumatic normocephalic.   ABDOMEN: soft  EXTREMITIES: No edema   BACK: Normal.  SKIN: Normal by inspection.   MENTAL STATUS: Alert and oriented. Speech, language and cognition are generally intact. Judgment and insight normal. Month and age adjusted appropriately.  CRANIAL NERVES: Pupils are equal, round and reactive to light and accommodation; extra ocular movements are full, there is no significant nystagmus; visual fields are full; upper and lower facial muscles are normal in strength and symmetric, there is flattening of the nasolabial fold - L; tongue is midline; uvula is midline; shoulder elevation is normal.  MOTOR: Left upper  extremity 4+/5. Mild left upper extremity pronator drift. Left leg 4+/5 with no drift. Right side shows normal tone, bulk and strength. No drift on the right side.  COORDINATION: Left finger to nose is normal, right finger to nose is normal, No rest tremor; no intention tremor; no postural tremor; no bradykinesia.  REFLEXES: Deep tendon reflexes are symmetrical and normal. Babinski reflexes are flexor bilaterally.   SENSATION: There is reduced sensation to light touch and temperature left upper and left lower extremity. There is no extinction to double simultaneous stimulation.    Objective: Vital signs in last 24 hours: Temp:  [97.9 F (36.6 C)-98.2 F (36.8 C)] 98.2 F (36.8 C) (04/15 6761) Pulse Rate:  [68-76] 68 (04/15 0638) Resp:  [18-20] 20 (04/15 9509) BP: (157-191)/(76-94) 171/86 mmHg (04/15 0638) SpO2:  [98 %-100 %] 100 % (04/15 3267)  Intake/Output from previous day: 04/14 0701 - 04/15 0700 In: 720 [P.O.:720] Out: -  Intake/Output this shift:   Nutritional status: Diet heart healthy/carb modified Room service appropriate?: Yes; Fluid consistency:: Thin   Lab Results: Results for orders placed or performed during the hospital encounter of 09/06/14 (from the past 48 hour(s))  CBC with Differential     Status: Abnormal   Collection Time: 09/06/14 12:01 PM  Result Value Ref Range   WBC 6.9 4.0 - 10.5 K/uL   RBC 4.64 4.22 - 5.81 MIL/uL   Hemoglobin 11.8 (L) 13.0 - 17.0 g/dL   HCT 35.7 (L) 39.0 - 52.0 %   MCV 76.9 (L) 78.0 - 100.0 fL   MCH 25.4 (L) 26.0 - 34.0 pg   MCHC 33.1 30.0 - 36.0 g/dL  RDW 12.8 11.5 - 15.5 %   Platelets 225 150 - 400 K/uL   Neutrophils Relative % 80 (H) 43 - 77 %   Neutro Abs 5.5 1.7 - 7.7 K/uL   Lymphocytes Relative 12 12 - 46 %   Lymphs Abs 0.8 0.7 - 4.0 K/uL   Monocytes Relative 5 3 - 12 %   Monocytes Absolute 0.3 0.1 - 1.0 K/uL   Eosinophils Relative 3 0 - 5 %   Eosinophils Absolute 0.2 0.0 - 0.7 K/uL   Basophils Relative 0 0 - 1 %     Basophils Absolute 0.0 0.0 - 0.1 K/uL  Basic metabolic panel     Status: Abnormal   Collection Time: 09/06/14 12:01 PM  Result Value Ref Range   Sodium 133 (L) 135 - 145 mmol/L   Potassium 4.1 3.5 - 5.1 mmol/L   Chloride 94 (L) 96 - 112 mmol/L   CO2 29 19 - 32 mmol/L   Glucose, Bld 501 (H) 70 - 99 mg/dL   BUN 30 (H) 6 - 23 mg/dL   Creatinine, Ser 1.91 (H) 0.50 - 1.35 mg/dL   Calcium 9.7 8.4 - 10.5 mg/dL   GFR calc non Af Amer 35 (L) >90 mL/min   GFR calc Af Amer 41 (L) >90 mL/min    Comment: (NOTE) The eGFR has been calculated using the CKD EPI equation. This calculation has not been validated in all clinical situations. eGFR's persistently <90 mL/min signify possible Chronic Kidney Disease.    Anion gap 10 5 - 15  Hemoglobin A1c     Status: Abnormal   Collection Time: 09/06/14 12:01 PM  Result Value Ref Range   Hgb A1c MFr Bld 10.5 (H) 4.8 - 5.6 %    Comment: (NOTE)         Pre-diabetes: 5.7 - 6.4         Diabetes: >6.4         Glycemic control for adults with diabetes: <7.0    Mean Plasma Glucose 255 mg/dL    Comment: (NOTE) Performed At: [facility] [place], [place] [id number] Lindon Romp MD [id number]   Lipid panel     Status: Abnormal   Collection Time: 09/06/14 12:01 PM  Result Value Ref Range   Cholesterol 215 (H) 0 - 200 mg/dL   Triglycerides 323 (H) <150 mg/dL   HDL 31 (L) >39 mg/dL   Total CHOL/HDL Ratio 6.9 RATIO   VLDL 65 (H) 0 - 40 mg/dL   LDL Cholesterol 119 (H) 0 - 99 mg/dL    Comment:        Total Cholesterol/HDL:CHD Risk Coronary Heart Disease Risk Table                     Men   Women  1/2 Average Risk   3.4   3.3  Average Risk       5.0   4.4  2 X Average Risk   9.6   7.1  3 X Average Risk  23.4   11.0        Use the calculated Patient Ratio above and the CHD Risk Table to determine the patient's CHD Risk.        ATP III CLASSIFICATION (LDL):  <100     mg/dL   Optimal  100-129  mg/dL   Near or Above  Optimal  130-159  mg/dL   Borderline  160-189  mg/dL   High  >190     mg/dL   Very High   CBG monitoring, ED     Status: Abnormal   Collection Time: 09/06/14  2:35 PM  Result Value Ref Range   Glucose-Capillary 340 (H) 70 - 99 mg/dL  Glucose, capillary     Status: Abnormal   Collection Time: 09/06/14  4:09 PM  Result Value Ref Range   Glucose-Capillary 272 (H) 70 - 99 mg/dL   Comment 1 Notify RN    Comment 2 Document in Chart   Glucose, capillary     Status: Abnormal   Collection Time: 09/06/14  9:07 PM  Result Value Ref Range   Glucose-Capillary 183 (H) 70 - 99 mg/dL   Comment 1 Notify RN    Comment 2 Document in Chart   Glucose, capillary     Status: Abnormal   Collection Time: 09/07/14  7:18 AM  Result Value Ref Range   Glucose-Capillary 243 (H) 70 - 99 mg/dL   Comment 1 Notify RN    Comment 2 Document in Chart   Glucose, capillary     Status: Abnormal   Collection Time: 09/07/14 11:31 AM  Result Value Ref Range   Glucose-Capillary 241 (H) 70 - 99 mg/dL   Comment 1 Notify RN    Comment 2 Document in Chart   Glucose, capillary     Status: Abnormal   Collection Time: 09/07/14  4:17 PM  Result Value Ref Range   Glucose-Capillary 214 (H) 70 - 99 mg/dL   Comment 1 Notify RN   Glucose, capillary     Status: Abnormal   Collection Time: 09/07/14  9:58 PM  Result Value Ref Range   Glucose-Capillary 217 (H) 70 - 99 mg/dL   Comment 1 Notify RN    Comment 2 Document in Chart   Basic metabolic panel     Status: Abnormal   Collection Time: 09/08/14  6:13 AM  Result Value Ref Range   Sodium 139 135 - 145 mmol/L   Potassium 3.7 3.5 - 5.1 mmol/L   Chloride 105 96 - 112 mmol/L    Comment: DELTA CHECK NOTED   CO2 26 19 - 32 mmol/L   Glucose, Bld 179 (H) 70 - 99 mg/dL   BUN 22 6 - 23 mg/dL   Creatinine, Ser 1.53 (H) 0.50 - 1.35 mg/dL   Calcium 8.9 8.4 - 10.5 mg/dL   GFR calc non Af Amer 46 (L) >90 mL/min   GFR calc Af Amer 53 (L) >90 mL/min    Comment:  (NOTE) The eGFR has been calculated using the CKD EPI equation. This calculation has not been validated in all clinical situations. eGFR's persistently <90 mL/min signify possible Chronic Kidney Disease.    Anion gap 8 5 - 15  TSH     Status: None   Collection Time: 09/08/14  7:40 AM  Result Value Ref Range   TSH 1.192 0.350 - 4.500 uIU/mL  Glucose, capillary     Status: Abnormal   Collection Time: 09/08/14  8:07 AM  Result Value Ref Range   Glucose-Capillary 150 (H) 70 - 99 mg/dL   Comment 1 Notify RN     Lipid Panel  Recent Labs  09/06/14 1201  CHOL 215*  TRIG 323*  HDL 31*  CHOLHDL 6.9  VLDL 65*  LDLCALC 119*    Studies/Results:   Medications:  Scheduled Meds: . atorvastatin  80 mg  Oral q1800  . clopidogrel  75 mg Oral Daily  . enoxaparin (LOVENOX) injection  40 mg Subcutaneous Q24H  . insulin aspart  0-15 Units Subcutaneous TID WC  . insulin aspart  0-5 Units Subcutaneous QHS  . insulin detemir  13 Units Subcutaneous Daily   Continuous Infusions: . sodium chloride 100 mL/hr at 09/07/14 2339   PRN Meds:.acetaminophen **OR** acetaminophen, senna-docusate     LOS: 2 days   Jim [name] A. Merlene [name], M.D.  Diplomate, [employer] of [employer]iatry and Neurology ( Neurology).

## 2014-09-08 NOTE — Progress Notes (Signed)
Pt's IV catheter removed and intact. Pt's IV site clean dry and intact. Discharge instructions and prescriptions reviewed and discussed with patient. All follow up appointments were discussed with patient. All questions were answered and no further questions at this time. Stroke education performed with patient and handout given to patient. Pt verbalized understanding with teachback. Pt escorted by nurse tech.

## 2014-09-08 NOTE — Discharge Summary (Signed)
Physician Discharge Summary  Lam Mccubbins DZH:[id number] DOB: [birth year] DOA: 09/06/2014  PCP: Petra Kuba, MD  Admit date: 09/06/2014 Discharge date: 09/08/2014  Time spent: 60 minutes  Recommendations for Outpatient Follow-up:  1. Follow up with neurologist in 6 weeks. 2. Follow up with your PCP in one week.   Discharge Diagnoses:  Principal Problem:   CVA (cerebral vascular accident) Active Problems:   Prostate cancer   Left-sided weakness   Hypertension   Diabetes   Chronic kidney disease   GERD (gastroesophageal reflux disease)   Hyponatremia   Non compliance with medical treatment   CVA (cerebral infarction)   Discharge Condition: Same.  Diet recommendation: Heart healthy.   Filed Weights   09/06/14 1111  Weight: 88.451 kg (195 lb)    History of present illness:  Patient was admitted by me on September 06, 2014 for acute CVA.  As per my previous H and P, with Dyanne Carrel, NP : "   HPI: Zabian Swayne is a very pleasant 64 y.o. male a past medical history that includes hypertension, diabetes, chronic kidney disease, GERD, noncompliance, prostate cancer status post radiation therapy presents to the emergency department with the chief complaint gradual onset and worsening of left-sided weakness. Initial evaluation reveals hyperglycemia with a serum glucose of 501 and is concerning for CVA.  He reports that about 5 days ago he "ran out of my diabetes medicine". About 3 days ago started with some intermittent weakness and muscle cramping of the left leg particularly. Associated symptoms include cramping of the left hand, cramping of the left leg, dizziness, left lower extremity weakness, and some slow slurred speech. This morning he awakened and "my leg wouldn't work" so he decided come to the emergency room.   He denies chest pain palpitations headache visual disturbances numbness or tingling. He denies difficulty swallowing diaphoresis shortness of breath  abdominal pain. He denies dysuria hematuria frequency or urgency.   Workup includes includes comprehensive metabolic panel significant for sodium of 133 chloride 94 BUN 30 creatinine 1.91 serum glucose 501, complete blood counts significant for hemoglobin of 11.8. CT of the head reveals atrophy with small vessel chronic ischemic changes as well as old lacunar infarct left external capsule and no acute intracranial abnormalities. He is hemodynamically stable afebrile and not hypoxic. In the emergency department he is given 12 units of NovoLog insulin as well as 2 L of normal saline.   Hospital Course:  Patient was admitted into telemetry and it was felt that he had an acute CVA.  MRI of the brain showed acute right pontine infarct as well as right vertebral and arterial stenosis and severe bilateral P2 PCA stenosis and Mild left ICA and mild-to-moderate proximal left ACA stenoses. 2-D echo with mild LVH EF 60% and grade 1 diastolic dysfunction, carotid Dopplers with no significant stenosis. Hemoglobin A1c 13 and lipid panel with cholesterol 215, triglyceride 323 HDL 31 LDL 119. He was already on aspirin so he was switched to Plavix and start a statin.  He was seen by Neurology, and Dr Merlene Laughter recommnended DAPT (dual antiplatelet therapy) with ASA and Plavix for 6 months, then only ASA once per day.  He would like to see him in follow up in 6 weeks.  PT worked with him, and recommended a rolling walker, and to have outpatient PT.  He is anxious to go home and is stable for discharge.  Thank you for allowing me to participate in his care.     Consultations:  Dr  DoonQuah.  Discharge Exam: Filed Vitals:   09/08/14 0944  BP: 191/88  Pulse: 71  Temp: 97.5 F (36.4 C)  Resp: 19    General: Well, A and Ox 3.  Cardiovascular: S1S2 Regular.  Respiratory: Clear lungs.   Discharge Instructions:  Take ASA and Plavix for 6 months, then only ASA after that.  See Dr Merlene Laughter in 6 weeks and see PCP in  one week.     Discharge Instructions    Diet - low sodium heart healthy    Complete by:  As directed      Discharge instructions    Complete by:  As directed   Take your Plavix and ASA for 6 months, then only ASA. Follow up with Dr Merlene Laughter in 6 weeks.     Increase activity slowly    Complete by:  As directed           Current Discharge Medication List    START taking these medications   Details  atorvastatin (LIPITOR) 80 MG tablet Take 1 tablet (10 mg total) by mouth daily at 6 PM. Qty: 30 tablet, Refills: 1    clopidogrel (PLAVIX) 75 MG tablet Take 1 tablet (75 mg total) by mouth daily. Qty: 30 tablet, Refills: 1    insulin detemir (LEVEMIR) 100 UNIT/ML injection Inject 0.13 mLs (13 Units total) into the skin daily. Qty: 10 mL, Refills: 11      CONTINUE these medications which have CHANGED   Details  glipiZIDE (GLUCOTROL XL) 10 MG 24 hr tablet Take 1 tablet (10 mg total) by mouth every morning. Qty: 30 tablet, Refills: 1      CONTINUE these medications which have NOT CHANGED   Details  amLODipine (NORVASC) 10 MG tablet Take 10 mg by mouth every morning.    metFORMIN (GLUCOPHAGE-XR) 500 MG 24 hr tablet Take 1,000 mg by mouth 2 (two) times daily.     metoprolol succinate (TOPROL-XL) 50 MG 24 hr tablet Take 1 tablet by mouth daily. Refills: 0    naproxen sodium (ANAPROX) 220 MG tablet Take 220 mg by mouth 2 (two) times daily as needed (pain).      STOP taking these medications     aspirin EC 81 MG tablet      insulin NPH (HUMULIN N,NOVOLIN N) 100 UNIT/ML injection      losartan-hydrochlorothiazide (HYZAAR) 100-25 MG per tablet      sennosides-docusate sodium (SENOKOT-S) 8.6-50 MG tablet        No Known Allergies Follow-up Information    Follow up with Sena Hitch On 09/14/2014.   Why:  at 10:00 am   Contact information:   [phone]       The results of significant diagnostics from this hospitalization (including imaging, microbiology,  ancillary and laboratory) are listed below for reference.    Significant Diagnostic Studies: Dg Chest 2 View  09/06/2014   CLINICAL DATA:  Left-sided weakness ; hypertension  EXAM: CHEST  2 VIEW  COMPARISON:  July 09, 2011  FINDINGS: There is no edema or consolidation. Heart size and pulmonary vascularity are normal. No adenopathy. There is degenerative change in the thoracic spine.  IMPRESSION: No edema or consolidation.   Electronically Signed   By: Lowella Grip III M.D.   On: 09/06/2014 17:07   Ct Head Wo Contrast  09/06/2014   CLINICAL DATA:  LEFT side weakness for 3 days, slurred speech since yesterday, history diabetes, hypertension, prostate cancer post radiation therapy, chronic kidney disease  EXAM: CT HEAD  WITHOUT CONTRAST  TECHNIQUE: Contiguous axial images were obtained from the base of the skull through the vertex without intravenous contrast.  COMPARISON:  10/05/2010  FINDINGS: Generalized atrophy.  Normal ventricular morphology.  No midline shift or mass effect.  Small vessel chronic ischemic changes of deep cerebral white matter.  Old appearing LEFT external capsule lacunar infarct.  No intracranial hemorrhage, mass lesion, or acute infarction.  Visualized paranasal sinuses and mastoid air cells clear.  Small calvarial exostosis LEFT frontal bone unchanged.  Bones otherwise unremarkable.  Atherosclerotic calcifications within internal carotid and vertebral arteries at skullbase.  IMPRESSION: Atrophy with small vessel chronic ischemic changes of deep cerebral white matter.  Old lacunar infarct LEFT external capsule.  No acute intracranial abnormalities.   Electronically Signed   By: Lavonia Dana M.D.   On: 09/06/2014 11:49   Mr Brain Wo Contrast  09/06/2014   CLINICAL DATA:  Left-sided weakness beginning 3 days ago. Speech disturbance today.  EXAM: MRI HEAD WITHOUT CONTRAST  MRA HEAD WITHOUT CONTRAST  TECHNIQUE: Multiplanar, multiecho pulse sequences of the brain and surrounding  structures were obtained without intravenous contrast. Angiographic images of the head were obtained using MRA technique without contrast.  COMPARISON:  Head CT 09/06/2014  FINDINGS: MRI HEAD FINDINGS  There are a few subcentimeter foci of acute infarction in the right pons. A focus of chronic microhemorrhage is noted in the right frontal operculum. There is no evidence of mass, midline shift, or extra-axial fluid collection. There is mild generalized cerebral atrophy. Patchy and confluent T2 hyperintensities in the periventricular greater than subcortical cerebral white matter are advanced for age and nonspecific but compatible with moderate chronic small vessel ischemic disease. Chronic left basal ganglia lacunar infarct is noted.  Prior bilateral cataract extraction is noted. Paranasal sinuses and mastoid air cells are clear. Major intracranial vascular flow voids are preserved.  MRA HEAD FINDINGS  Decreased intensity of flow related enhancement in the distal V3 and proximal V4 segments is likely artifactual. The left vertebral artery is hypoplastic and ends in PICA. The right vertebral artery is dominant and supplies the basilar. Right PICA origin is patent. There is atherosclerotic irregularity with areas of mild to moderate narrowing involving the distal right vertebral artery and entire basilar artery. The left SCA origin is patent. Right SCA origin appears patent, however a severe proximal right SCA stenosis is suspected. There is a fetal origin of the right PCA. A patent left posterior communicating artery is also present. There is a severe proximal left P2 stenosis. There is also a severe mid right P2 stenosis.  Internal carotid arteries are patent from skullbase to carotid termini. There is mild narrowing of the proximal left cavernous and left supraclinoid ICA. There is a mild-to-moderate proximal left A1 stenosis. ACAs are otherwise unremarkable. MCAs are patent without significant stenosis. No  intracranial aneurysm is identified.  IMPRESSION: 1. Acute right pontine infarcts. 2. Moderate chronic small vessel ischemic disease and chronic left basal ganglia lacunar infarct. 3. No major intracranial arterial occlusion. Advanced posterior circulation atherosclerosis with moderate distal right vertebral and basilar artery stenosis and severe bilateral P2 PCA stenoses. 4. Mild left ICA and mild-to-moderate proximal left ACA stenoses.   Electronically Signed   By: Logan Bores   On: 09/06/2014 18:05   US Carotid Bilateral  09/06/2014   CLINICAL DATA:  64 year old male with a history of left-sided weakness for 3 days.  Cardiovascular risk factors include hypertension, prior stroke/ TIA, diabetes  EXAM: BILATERAL CAROTID DUPLEX ULTRASOUND  TECHNIQUE: Pearline Cables scale imaging, color Doppler and duplex ultrasound were performed of bilateral carotid and vertebral arteries in the neck.  COMPARISON:  None  FINDINGS: Criteria: Quantification of carotid stenosis is based on velocity parameters that correlate the residual internal carotid diameter with NASCET-based stenosis levels, using the diameter of the distal internal carotid lumen as the denominator for stenosis measurement.  The following velocity measurements were obtained:  RIGHT  ICA:  Systolic 73 cm/sec, Diastolic 22 cm/sec  CCA:  93 cm/sec  SYSTOLIC ICA/CCA RATIO:  0.8  ECA:  83 cm/sec  LEFT  ICA:  Systolic 70 cm/sec, Diastolic 15 cm/sec  CCA:  712 cm/sec  SYSTOLIC ICA/CCA RATIO:  0.5  ECA:  109 cm/sec  Right Brachial SBP: Not acquired  Left Brachial SBP: Not acquired  RIGHT CAROTID ARTERY: Mild atherosclerotic changes of the right common carotid artery. Intermediate waveform maintained. Heterogeneous plaque at the right carotid bifurcation without significant calcifications. Low resistance waveform of the right ICA.  RIGHT VERTEBRAL ARTERY: Antegrade flow with low resistance waveform.  LEFT CAROTID ARTERY: Mild atherosclerotic changes of the left common carotid  artery. Intermediate waveform maintained. Heterogeneous plaque at the left carotid bifurcation without significant calcifications. Low resistance waveform of the left ICA.  LEFT VERTEBRAL ARTERY:  Antegrade flow with low resistance waveform.  IMPRESSION: Color duplex indicates minimal heterogeneous plaque, with no hemodynamically significant stenosis by duplex criteria in the extracranial cerebrovascular circulation.  Signed,  Dulcy Fanny. Earleen Newport, DO  [facility]  [facility]   Electronically Signed   By: Corrie Mckusick D.O.   On: 09/06/2014 17:27   Mr Jodene Nam Head/brain Wo Cm  09/06/2014   CLINICAL DATA:  Left-sided weakness beginning 3 days ago. Speech disturbance today.  EXAM: MRI HEAD WITHOUT CONTRAST  MRA HEAD WITHOUT CONTRAST  TECHNIQUE: Multiplanar, multiecho pulse sequences of the brain and surrounding structures were obtained without intravenous contrast. Angiographic images of the head were obtained using MRA technique without contrast.  COMPARISON:  Head CT 09/06/2014  FINDINGS: MRI HEAD FINDINGS  There are a few subcentimeter foci of acute infarction in the right pons. A focus of chronic microhemorrhage is noted in the right frontal operculum. There is no evidence of mass, midline shift, or extra-axial fluid collection. There is mild generalized cerebral atrophy. Patchy and confluent T2 hyperintensities in the periventricular greater than subcortical cerebral white matter are advanced for age and nonspecific but compatible with moderate chronic small vessel ischemic disease. Chronic left basal ganglia lacunar infarct is noted.  Prior bilateral cataract extraction is noted. Paranasal sinuses and mastoid air cells are clear. Major intracranial vascular flow voids are preserved.  MRA HEAD FINDINGS  Decreased intensity of flow related enhancement in the distal V3 and proximal V4 segments is likely artifactual. The left vertebral artery is hypoplastic and ends in PICA.  The right vertebral artery is dominant and supplies the basilar. Right PICA origin is patent. There is atherosclerotic irregularity with areas of mild to moderate narrowing involving the distal right vertebral artery and entire basilar artery. The left SCA origin is patent. Right SCA origin appears patent, however a severe proximal right SCA stenosis is suspected. There is a fetal origin of the right PCA. A patent left posterior communicating artery is also present. There is a severe proximal left P2 stenosis. There is also a severe mid right P2 stenosis.  Internal carotid arteries are patent from skullbase to carotid termini. There is mild narrowing of the proximal left cavernous and left supraclinoid  ICA. There is a mild-to-moderate proximal left A1 stenosis. ACAs are otherwise unremarkable. MCAs are patent without significant stenosis. No intracranial aneurysm is identified.  IMPRESSION: 1. Acute right pontine infarcts. 2. Moderate chronic small vessel ischemic disease and chronic left basal ganglia lacunar infarct. 3. No major intracranial arterial occlusion. Advanced posterior circulation atherosclerosis with moderate distal right vertebral and basilar artery stenosis and severe bilateral P2 PCA stenoses. 4. Mild left ICA and mild-to-moderate proximal left ACA stenoses.   Electronically Signed   By: Logan Bores   On: 09/06/2014 18:05    Microbiology: No results found for this or any previous visit (from the past 240 hour(s)).   Labs: Basic Metabolic Panel:  Recent Labs Lab 09/06/14 1201 09/08/14 0613  NA 133* 139  K 4.1 3.7  CL 94* 105  CO2 29 26  GLUCOSE 501* 179*  BUN 30* 22  CREATININE 1.91* 1.53*  CALCIUM 9.7 8.9   CBC:  Recent Labs Lab 09/06/14 1201  WBC 6.9  NEUTROABS 5.5  HGB 11.8*  HCT 35.7*  MCV 76.9*  PLT 225    CBG:  Recent Labs Lab 09/07/14 1131 09/07/14 1617 09/07/14 2158 09/08/14 0807 09/08/14 1149  GLUCAP 241* 214* 217* 150* 253*     Signed:  Tania Steinhauser  [facility] 09/08/2014, 2:39 PM

## 2014-09-09 LAB — VITAMIN B12: Vitamin B-12: 322 pg/mL (ref 211–911)

## 2014-09-09 LAB — RPR: RPR Ser Ql: NONREACTIVE

## 2014-09-10 LAB — HOMOCYSTEINE: HOMOCYSTEINE-NORM: 17.3 umol/L — AB (ref 0.0–15.0)

## 2014-09-11 LAB — BETA-2-GLYCOPROTEIN I ABS, IGG/M/A
Beta-2 Glyco I IgG: <9 GPI IgG units (ref 0–20)
Beta-2-Glycoprotein I IgA: <9 GPI IgA units (ref 0–25)
Beta-2-Glycoprotein I IgM: <9 GPI IgM units (ref 0–32)

## 2014-09-11 LAB — PROTEIN C, TOTAL: PROTEIN C, TOTAL: 113 % (ref 70–140)

## 2014-09-11 NOTE — Care Management Utilization Note (Signed)
UR COMPLETED  

## 2014-09-12 LAB — FACTOR 5 LEIDEN

## 2014-09-12 LAB — PROTHROMBIN GENE MUTATION

## 2014-09-12 LAB — CARDIOLIPIN ANTIBODIES, IGG, IGM, IGA
Anticardiolipin IgA: <9 APL U/mL (ref 0–11)
Anticardiolipin IgG: <9 GPL U/mL (ref 0–14)
Anticardiolipin IgM: <9 MPL U/mL (ref 0–12)

## 2014-09-12 LAB — PROTEIN S, TOTAL: PROTEIN S AG TOTAL: 146 % (ref 58–150)

## 2014-09-12 LAB — LUPUS ANTICOAGULANT PANEL
DRVVT: 52.7 s (ref 0.0–55.1)
PTT LA: 39.7 s (ref 0.0–50.0)

## 2014-09-12 LAB — PROTEIN C ACTIVITY: Protein C Activity: 134 % (ref 74–151)

## 2014-09-12 LAB — PROTEIN S ACTIVITY: Protein S Activity: 107 % (ref 60–145)

## 2014-09-14 DIAGNOSIS — I69959 Hemiplegia and hemiparesis following unspecified cerebrovascular disease affecting unspecified side: Secondary | ICD-10-CM | POA: Diagnosis not present

## 2014-09-14 DIAGNOSIS — C61 Malignant neoplasm of prostate: Secondary | ICD-10-CM | POA: Diagnosis not present

## 2014-09-14 DIAGNOSIS — E119 Type 2 diabetes mellitus without complications: Secondary | ICD-10-CM | POA: Diagnosis not present

## 2014-09-14 DIAGNOSIS — E785 Hyperlipidemia, unspecified: Secondary | ICD-10-CM | POA: Diagnosis not present

## 2014-09-14 DIAGNOSIS — R944 Abnormal results of kidney function studies: Secondary | ICD-10-CM | POA: Diagnosis not present

## 2014-09-14 DIAGNOSIS — I10 Essential (primary) hypertension: Secondary | ICD-10-CM | POA: Diagnosis not present

## 2014-09-15 ENCOUNTER — Ambulatory Visit (HOSPITAL_COMMUNITY): Payer: BLUE CROSS/BLUE SHIELD | Attending: Family Medicine | Admitting: Physical Therapy

## 2014-09-15 DIAGNOSIS — I69354 Hemiplegia and hemiparesis following cerebral infarction affecting left non-dominant side: Secondary | ICD-10-CM | POA: Insufficient documentation

## 2014-09-15 DIAGNOSIS — R29898 Other symptoms and signs involving the musculoskeletal system: Secondary | ICD-10-CM

## 2014-09-15 DIAGNOSIS — R262 Difficulty in walking, not elsewhere classified: Secondary | ICD-10-CM | POA: Diagnosis not present

## 2014-09-15 DIAGNOSIS — R2689 Other abnormalities of gait and mobility: Secondary | ICD-10-CM

## 2014-09-15 NOTE — Patient Instructions (Signed)
Balance: Unilateral   Attempt to balance on left leg, eyes open. Hold __10-60__ seconds. Repeat __3-5__ times per set. Do __1__ sets per session. Do _2___ sessions per day. Perform exercise with eyes closed.  http://orth.exer.us/28   Copyright  VHI. All rights reserved.  Heel Raise: Bilateral (Standing)   Rise on balls of feet. Repeat __10-15__ times per set. Do __1__ sets per session. Do ___3_ sessions per day.  http://orth.exer.us/38   Copyright  VHI. All rights reserved.  Strengthening: Straight Leg Raise (Phase 1)   Tighten muscles on front of left thigh, then lift leg _15___ inches from surface, keeping knee locked.  Repeat 10-15____ times per set. Do _1___ sets per session. Do __3__ sessions per day.  http://orth.exer.us/614   C Copyright  VHI. All rights reserved.  Bridging   Slowly raise buttocks from floor, keeping stomach tight. Now raise Rt foot up and lower down. Repeat __10-15__ times per set. Do __1__ sets per session. Do _2___ sessions per day.  http://orth.exer.us/1096   Copyright  VHI. All rights reserved.  Strengthening: Hip Abduction (Side-Lying)   Tighten muscles on front of left thigh, then lift leg __15__ inches from surface, keeping knee locked.  Repeat 3____ times per set. Do _1___ sets per session. Do ___3_ sessions per day.  http://orth.exer.us/622   Copyright  VHI. All rights reserved.  Self-Mobilization: Knee Flexion (Prone)   Bring left heel toward buttocks as close as possible. Hold ___2_ seconds. Relax. Repeat _10-15___ times per set. Do __1__ sets per session. Do __3__ sessions per day.  http://orth.exer.us/596   Copyright  VHI. All rights reserved.

## 2014-09-15 NOTE — Therapy (Addendum)
[place], [place], [address] Phone: [phone]   Fax:  [phone]  Physical Therapy Evaluation  Patient Details  Name: Jim [name] MRN: [id number] Date of Birth: 24-Oct-1950 Referring Provider:  Petra Kuba, MD  Encounter Date: 09/15/2014      PT End of Session - 09/15/14 1144    Visit Number 1   Number of Visits 8   Date for PT Re-Evaluation 10/15/14   Authorization Type BCBS   PT Start Time 1104   PT Stop Time 1145   PT Time Calculation (min) 41 min      Past Medical History  Diagnosis Date  . Hypertension   . Diabetes mellitus   . Chronic kidney disease     cyst on left kidney   . GERD (gastroesophageal reflux disease)   . Degenerative disc disease, lumbar   . Prostate cancer 06/11/12    gleason 7  . Hx of radiation therapy 12/13/12- 01/27/13    prostate bed 6600 cGy 33 sessions    Past Surgical History  Procedure Laterality Date  . Robot assisted laparoscopic radical prostatectomy  06/11/2012    Procedure: ROBOTIC ASSISTED LAPAROSCOPIC RADICAL PROSTATECTOMY;  Surgeon: Alexis Frock, MD;  Location: [facility];  Service: Urology;  Laterality: N/A;  1st procedure: Robotic Left Renal Cyst Decortication   . Lymphadenectomy  06/11/2012    Procedure: LYMPHADENECTOMY;  Surgeon: Alexis Frock, MD;  Location: [facility];  Service: Urology;  Laterality: Bilateral;  . Robotic assited partial nephrectomy  06/11/2012    Procedure: ROBOTIC ASSITED PARTIAL NEPHRECTOMY;  Surgeon: Alexis Frock, MD;  Location: [facility];  Service: Urology;  Laterality: Left;  . Prostate biopsy  03/25/2012    There were no vitals filed for this visit.  Visit Diagnosis:  Difficulty walking  Unstable balance  Left leg weakness      Subjective Assessment - 09/15/14 1109    Subjective Jim [name] states that he was admitted to the hospital with a stroke on 09/06/2014; he was discharged on the 15th and is now being referred to  out-patient therapy.  He states he still can tell that he has weakness in his Lt leg.  The pt comes in using a cane for ambulation and states he used no assistive device prior to the stroke.    Pertinent History HTN; OA Lt hip   How long can you sit comfortably? no problem   How long can you stand comfortably? Pt has only stood less than 3 minutes.   How long can you walk comfortably? walking with a cane for 3 to 5 minutes onlye    Patient Stated Goals walk without the cane; get back to his yard work ; walking for exercises    Currently in Pain? No/denies            [facility] PT Assessment - 09/15/14 1103    Assessment   Medical Diagnosis Rt CVA   Onset Date 09/06/14   Prior Therapy acute    Precautions   Precautions None   Restrictions   Weight Bearing Restrictions No   Balance Screen   Has the patient fallen in the past 6 months No   Has the patient had a decrease in activity level because of a fear of falling?  Yes   Is the patient reluctant to leave their home because of a fear of falling?  Yes   [name] Private residence   Home Access Stairs  to enter   Entrance Stairs-Number of Steps 1   Home Layout One level   Prior Function   Level of Independence Independent with basic ADLs   Vocation Retired   Leisure walking/ yard work    Observation/Other Assessments   Focus on Therapeutic Outcomes (FOTO)  60   Single Leg Stance   Comments Rt 7 seconds; 2 seconds Lt    Sit to Stand   Comments 9 in 30 second s    Strength   Left Hip Flexion 3+/5   Left Hip Extension 2/5   Left Hip ABduction 2+/5   Left Knee Flexion 3-/5   Left Knee Extension 5/5   Left Ankle Dorsiflexion 4/5   Left Ankle Plantar Flexion 2/5                   [facility] Adult PT Treatment/Exercise - 09/15/14 0001    Exercises   Exercises Knee/Hip   Knee/Hip Exercises: Standing   Heel Raises 10 reps   SLS x3   Knee/Hip Exercises: Supine   Bridges 10 reps   Bridges  Limitations come down on Lt only   Straight Leg Raises 5 reps   Knee/Hip Exercises: Sidelying   Hip ABduction 5 reps   Knee/Hip Exercises: Prone   Hamstring Curl 10 reps                PT Education - 09/15/14 1143    Education provided Yes   Education Details HEP   Person(s) Educated Patient;Spouse   Methods Explanation;Handout   Comprehension Verbalized understanding;Returned demonstration          PT Short Term Goals - 09/15/14 1241    PT SHORT TERM GOAL #1   Title I HEP   Time 1   Period Days   PT SHORT TERM GOAL #2   Title Pt to be ambulating inside without a cane   Time 2   Period Weeks   PT SHORT TERM GOAL #3   Title Pt to be able to go up and down steps in a reciprocal manner   Time 2   Period Weeks           PT Long Term Goals - 09/15/14 1242    PT LONG TERM GOAL #1   Title I in advance HEP    Time 4   Period Weeks   PT LONG TERM GOAL #2   Title Pt strrength to be wnl to allow pt to sqaut down and come up to be able to work in his yard    Time 4   Period Weeks   PT LONG TERM GOAL #3   Title Pt SLS to 10 seconds B to allow pt to have balance to ambulate on uneven surfaces without cane.   Time 4   Period Weeks   PT LONG TERM GOAL #4   Title Pt to be walking for 30 minutes at least 4 times a week for better health habits.   Time 4   Period Weeks               Plan - 09/15/14 1144    Clinical Impression Statement Jim [name] is a 64 yo male who has had a recent Rt CVA .  He has been referred to skilled physical therapy.  Examination demonstrates decreased balance, decreased strength and difficulty walking.  Jim [name] will benefit from skilled PT to address these issues and maximize his functional abillty to improve his qualit of life.  Pt will benefit from skilled therapeutic intervention in order to improve on the following deficits Abnormal gait;Decreased balance;Difficulty walking;Decreased strength   Rehab Potential Good   PT  Frequency 2x / week   PT Duration 4 weeks   PT Treatment/Interventions Gait training;Stair training;Therapeutic activities;Therapeutic exercise;Balance training;Patient/family education;Functional mobility training   PT Next Visit Plan Standing knee flexion; Lt step ups both forward and lateral; lunging; tandem gt on foam side step on foam, mini squat with Lt only and sit to stand; progress to gt with assistive device; lunge walking , sidestepping with t-band.    PT Home Exercise Plan given   Consulted and Agree with Plan of Care Patient      G Code: Foto: 60 with 40% limitation  [id number] CJ [id number] CI   Problem List Patient Active Problem List   Diagnosis Date Noted  . CVA (cerebral vascular accident) 09/06/2014  . Left-sided weakness 09/06/2014  . Hyponatremia 09/06/2014  . Non compliance with medical treatment 09/06/2014  . CVA (cerebral infarction) 09/06/2014  . Hypertension   . Diabetes   . Chronic kidney disease   . GERD (gastroesophageal reflux disease)   . Hx of radiation therapy   . Prostate cancer 06/11/2012  Rayetta Humphrey, PT CLT [phone] 09/15/2014, 12:48 PM  [place] [address] [place], [place], [address] Phone: [phone]   Fax:  [phone]

## 2014-09-26 ENCOUNTER — Ambulatory Visit (HOSPITAL_COMMUNITY): Payer: BLUE CROSS/BLUE SHIELD | Attending: Family Medicine | Admitting: Physical Therapy

## 2014-09-26 DIAGNOSIS — I69354 Hemiplegia and hemiparesis following cerebral infarction affecting left non-dominant side: Secondary | ICD-10-CM | POA: Diagnosis not present

## 2014-09-26 DIAGNOSIS — R29898 Other symptoms and signs involving the musculoskeletal system: Secondary | ICD-10-CM

## 2014-09-26 DIAGNOSIS — R2689 Other abnormalities of gait and mobility: Secondary | ICD-10-CM

## 2014-09-26 DIAGNOSIS — R262 Difficulty in walking, not elsewhere classified: Secondary | ICD-10-CM | POA: Insufficient documentation

## 2014-09-26 NOTE — Therapy (Signed)
[place], [place], [address] Phone: [phone]   Fax:  [phone]  Physical Therapy Treatment  Patient Details  Name: Jim [name] MRN: [id number] Date of Birth: 15-Mar-1951 Referring Provider:  Petra Kuba, MD  Encounter Date: 09/26/2014      PT End of Session - 09/26/14 0842    Visit Number 2   Number of Visits 8   Date for PT Re-Evaluation 10/15/14   Authorization Type BCBS   PT Start Time 0804   PT Stop Time 0849   PT Time Calculation (min) 45 min   Equipment Utilized During Treatment Gait belt   Activity Tolerance Patient tolerated treatment well      Past Medical History  Diagnosis Date  . Hypertension   . Diabetes mellitus   . Chronic kidney disease     cyst on left kidney   . GERD (gastroesophageal reflux disease)   . Degenerative disc disease, lumbar   . Prostate cancer 06/11/12    gleason 7  . Hx of radiation therapy 12/13/12- 01/27/13    prostate bed 6600 cGy 33 sessions    Past Surgical History  Procedure Laterality Date  . Robot assisted laparoscopic radical prostatectomy  06/11/2012    Procedure: ROBOTIC ASSISTED LAPAROSCOPIC RADICAL PROSTATECTOMY;  Surgeon: Jim Frock, MD;  [facility];  Service: Urology;  Laterality: N/A;  1st procedure: Robotic Left Renal Cyst Decortication   . Lymphadenectomy  06/11/2012    Procedure: LYMPHADENECTOMY;  Surgeon: Jim Frock, MD;  [facility];  Service: Urology;  Laterality: Bilateral;  . Robotic assited partial nephrectomy  06/11/2012    Procedure: ROBOTIC ASSITED PARTIAL NEPHRECTOMY;  Surgeon: Jim Frock, MD;  [facility];  Service: Urology;  Laterality: Left;  . Prostate biopsy  03/25/2012    There were no vitals filed for this visit.  Visit Diagnosis:  Difficulty walking  Unstable balance  Left leg weakness      Subjective Assessment - 09/26/14 0802    Subjective Jim [name] states he has been doing his  exercises   Currently in Pain? No/denies              [facility] Adult PT Treatment/Exercise - 09/26/14 0001    Exercises   Exercises Knee/Hip   Knee/Hip Exercises: Aerobic   Stationary Bike nustep L4 x 8:00    Knee/Hip Exercises: Standing   Heel Raises 10 reps   Heel Raises Limitations no hands    Knee Flexion Strengthening;Left;10 reps   Forward Lunges Left;10 reps   Forward Lunges Limitations ont 14" stand   Lateral Step Up Left;10 reps;Hand Hold: 1;Step Height: 4"   Forward Step Up Left;10 reps;Hand Hold: 1;Step Height: 4"   Rocker Board 2 minutes   SLS x 5   Other Standing Knee Exercises tandem stance Rt front / Lt front x 1' each; balance beam side step and tandem x 2 RT    Other Standing Knee Exercises minisquat with Lt only x 10                   PT Short Term Goals - 09/15/14 1241    PT SHORT TERM GOAL #1   Title I HEP   Time 1   Period Days   PT SHORT TERM GOAL #2   Title Pt to be ambulating inside without a cane   Time 2   Period Weeks   PT SHORT TERM GOAL #3   Title Pt to  be able to go up and down steps in a reciprocal manner   Time 2   Period Weeks           PT Long Term Goals - 09/15/14 1242    PT LONG TERM GOAL #1   Title I in advance HEP    Time 4   Period Weeks   PT LONG TERM GOAL #2   Title Pt strrength to be wnl to allow pt to sqaut down and come up to be able to work in his yard    Time 4   Period Weeks   PT LONG TERM GOAL #3   Title Pt SLS to 10 seconds B to allow pt to have balance to ambulate on uneven surfaces without cane.   Time 4   Period Weeks   PT LONG TERM GOAL #4   Title Pt to be walking for 30 minutes at least 4 times a week for better health habits.   Time 4   Period Weeks               Plan - 09/26/14 2426    Clinical Impression Statement Pt did well with strengthening exercises; needed supervision for balance activities. All exercises facilitated by therapist for pt safety    PT Next Visit Plan begin  sit to stand with Lt leg in back, retrogt, lunge walking         Problem List Patient Active Problem List   Diagnosis Date Noted  . CVA (cerebral vascular accident) 09/06/2014  . Left-sided weakness 09/06/2014  . Hyponatremia 09/06/2014  . Non compliance with medical treatment 09/06/2014  . CVA (cerebral infarction) 09/06/2014  . Hypertension   . Diabetes   . Chronic kidney disease   . GERD (gastroesophageal reflux disease)   . Hx of radiation therapy   . Prostate cancer 06/11/2012   Jim [name] PT/CLT [phone] 09/26/2014, 8:45 AM  [place], [place], [address] Phone: [phone]   Fax:  [phone]

## 2014-09-27 ENCOUNTER — Ambulatory Visit (HOSPITAL_COMMUNITY): Payer: BLUE CROSS/BLUE SHIELD

## 2014-09-27 DIAGNOSIS — R29898 Other symptoms and signs involving the musculoskeletal system: Secondary | ICD-10-CM

## 2014-09-27 DIAGNOSIS — R262 Difficulty in walking, not elsewhere classified: Secondary | ICD-10-CM

## 2014-09-27 DIAGNOSIS — R2689 Other abnormalities of gait and mobility: Secondary | ICD-10-CM

## 2014-09-27 DIAGNOSIS — I69354 Hemiplegia and hemiparesis following cerebral infarction affecting left non-dominant side: Secondary | ICD-10-CM | POA: Diagnosis not present

## 2014-09-27 NOTE — Therapy (Signed)
[place], [place], [address] Phone: [phone]   Fax:  [phone]  Physical Therapy Treatment  Patient Details  Name: Jim [name] MRN: [id number] Date of Birth: 1951-01-31 Referring Provider:  Petra Kuba, MD  Encounter Date: 09/27/2014      PT End of Session - 09/27/14 1208    Visit Number 3   Number of Visits 8   Date for PT Re-Evaluation 10/15/14   Authorization Type BCBS   PT Start Time 1105   PT Stop Time 1200   PT Time Calculation (min) 55 min   Equipment Utilized During Treatment Gait belt   Activity Tolerance Patient tolerated treatment well   Behavior During Therapy [facility] for tasks assessed/performed      Past Medical History  Diagnosis Date  . Hypertension   . Diabetes mellitus   . Chronic kidney disease     cyst on left kidney   . GERD (gastroesophageal reflux disease)   . Degenerative disc disease, lumbar   . Prostate cancer 06/11/12    gleason 7  . Hx of radiation therapy 12/13/12- 01/27/13    prostate bed 6600 cGy 33 sessions    Past Surgical History  Procedure Laterality Date  . Robot assisted laparoscopic radical prostatectomy  06/11/2012    Procedure: ROBOTIC ASSISTED LAPAROSCOPIC RADICAL PROSTATECTOMY;  Surgeon: Alexis Frock, MD;  [facility];  Service: Urology;  Laterality: N/A;  1st procedure: Robotic Left Renal Cyst Decortication   . Lymphadenectomy  06/11/2012    Procedure: LYMPHADENECTOMY;  Surgeon: Alexis Frock, MD;  [facility];  Service: Urology;  Laterality: Bilateral;  . Robotic assited partial nephrectomy  06/11/2012    Procedure: ROBOTIC ASSITED PARTIAL NEPHRECTOMY;  Surgeon: Alexis Frock, MD;  [facility];  Service: Urology;  Laterality: Left;  . Prostate biopsy  03/25/2012    There were no vitals filed for this visit.  Visit Diagnosis:  Difficulty walking  Unstable balance  Left leg weakness      Subjective Assessment - 09/27/14  1121    Subjective Pt stated compliance with HEP, most difficulty currently with balance.  Pt reports he does walk sometime around the house without cane.   Currently in Pain? Yes   Pain Score 3    Pain Location Hip   Pain Orientation Left   Pain Descriptors / Indicators Tightness;[facility] Adult PT Treatment/Exercise - 09/27/14 0001    Exercises   Exercises Knee/Hip   Knee/Hip Exercises: Standing   Heel Raises 10 reps   Heel Raises Limitations squat lifting yellow ball from 14in step f/b heel raise with UE flexion   Lateral Step Up Left;15 reps;Hand Hold: 1;Step Height: 4"   Forward Step Up Left;15 reps;Hand Hold: 1;Step Height: 6"   Functional Squat 10 reps   Functional Squat Limitations squat lifting yellow ball from 14in step f/b heel raise with UE flexion   Lunge Walking - Round Trips 1RT   Rocker Board 2 minutes   Rocker Board Limitations R/L   SLS Lt 12", Rt 16" max of 5   Other Standing Knee Exercises tandem stance Rt front / Lt front x 1' each; balance beam tandem, retro and side step 2RT   Knee/Hip Exercises: Seated   Other Seated Knee Exercises 10 STS Lt foot back             PT Short Term Goals -  09/27/14 1211    PT SHORT TERM GOAL #1   Title I HEP   Status Achieved   PT SHORT TERM GOAL #2   Title Pt to be ambulating inside without a cane   Status On-going   PT SHORT TERM GOAL #3   Title Pt to be able to go up and down steps in a reciprocal manner   Status On-going           PT Long Term Goals - 09/27/14 1211    PT LONG TERM GOAL #1   Title I in advance HEP    PT LONG TERM GOAL #2   Title Pt strrength to be wnl to allow pt to sqaut down and come up to be able to work in his yard    Status On-going   PT LONG TERM GOAL #3   Title Pt SLS to 30 seconds B to allow pt to have balance to ambulate on uneven surfaces without cane.   Status Revised   PT LONG TERM GOAL #4   Title Pt to be walking for 30 minutes at least 4 times a  week for better health habits.               Plan - 09/27/14 1208    Clinical Impression Statement Progressed strengthening with advanced strenghtening exercises including squating for proper lifting and UE flexion lifting yellow tball for balance.  Began lunge walking for LE strengthening and sit to stands with Lt LE weight bearing.  Pt improving balance with ability to stand >10" Bil SLS, revised goals for increased SLS time.  Began retro walking on balance beam with min to mod assistnace for LOB episodes.   PT Next Visit Plan Begin resistance side stepping, continue balance activities and functional strengthening with squats, stair training and lunge walking        Problem List Patient Active Problem List   Diagnosis Date Noted  . CVA (cerebral vascular accident) 09/06/2014  . Left-sided weakness 09/06/2014  . Hyponatremia 09/06/2014  . Non compliance with medical treatment 09/06/2014  . CVA (cerebral infarction) 09/06/2014  . Hypertension   . Diabetes   . Chronic kidney disease   . GERD (gastroesophageal reflux disease)   . Hx of radiation therapy   . Prostate cancer 06/11/2012   Ihor [place], [place]; [place] [id number] [phone]  Aldona Lento 09/27/2014, 12:13 PM Rayetta Humphrey, PT CLT [place] [address] [place], [place], [address] Phone: [phone]   Fax:  [phone]

## 2014-10-03 ENCOUNTER — Ambulatory Visit (HOSPITAL_COMMUNITY): Payer: BLUE CROSS/BLUE SHIELD | Admitting: Physical Therapy

## 2014-10-03 DIAGNOSIS — R29898 Other symptoms and signs involving the musculoskeletal system: Secondary | ICD-10-CM

## 2014-10-03 DIAGNOSIS — R262 Difficulty in walking, not elsewhere classified: Secondary | ICD-10-CM

## 2014-10-03 DIAGNOSIS — R2689 Other abnormalities of gait and mobility: Secondary | ICD-10-CM

## 2014-10-03 DIAGNOSIS — I69354 Hemiplegia and hemiparesis following cerebral infarction affecting left non-dominant side: Secondary | ICD-10-CM | POA: Diagnosis not present

## 2014-10-03 NOTE — Therapy (Signed)
[place], [place], [address] Phone: [phone]   Fax:  [phone]  Physical Therapy Treatment  Patient Details  Name: Jim [name] MRN: [id number] Date of Birth: 1951/02/10 Referring Provider:  Petra Kuba, MD  Encounter Date: 10/03/2014      PT End of Session - 10/03/14 0920    Visit Number 4   Number of Visits 8   Date for PT Re-Evaluation 10/15/14   Authorization Type BCBS   PT Start Time 0848   PT Stop Time 0931   PT Time Calculation (min) 43 min   Equipment Utilized During Treatment Gait belt   Activity Tolerance Patient tolerated treatment well   Behavior During Therapy [facility] for tasks assessed/performed      Past Medical History  Diagnosis Date  . Hypertension   . Diabetes mellitus   . Chronic kidney disease     cyst on left kidney   . GERD (gastroesophageal reflux disease)   . Degenerative disc disease, lumbar   . Prostate cancer 06/11/12    gleason 7  . Hx of radiation therapy 12/13/12- 01/27/13    prostate bed 6600 cGy 33 sessions    Past Surgical History  Procedure Laterality Date  . Robot assisted laparoscopic radical prostatectomy  06/11/2012    Procedure: ROBOTIC ASSISTED LAPAROSCOPIC RADICAL PROSTATECTOMY;  Surgeon: Alexis Frock, MD;  [facility];  Service: Urology;  Laterality: N/A;  1st procedure: Robotic Left Renal Cyst Decortication   . Lymphadenectomy  06/11/2012    Procedure: LYMPHADENECTOMY;  Surgeon: Alexis Frock, MD;  [facility];  Service: Urology;  Laterality: Bilateral;  . Robotic assited partial nephrectomy  06/11/2012    Procedure: ROBOTIC ASSITED PARTIAL NEPHRECTOMY;  Surgeon: Alexis Frock, MD;  [facility];  Service: Urology;  Laterality: Left;  . Prostate biopsy  03/25/2012    There were no vitals filed for this visit.  Visit Diagnosis:  Difficulty walking  Unstable balance  Left leg weakness      Subjective Assessment - 10/03/14  0923    Subjective Pt states he is compliant with HEP and is ready for his Lt leg to get stronger.  Currently with c/o pain in his Lt hip only.    Currently in Pain? Yes   Pain Score 3    Pain Location Hip   Pain Orientation Left                         [facility] Adult PT Treatment/Exercise - 10/03/14 0001    Knee/Hip Exercises: Aerobic   Stationary Bike nustep L4 hills #3 LE only x 10:00    Knee/Hip Exercises: Standing   Heel Raises 10 reps   Heel Raises Limitations squat lifting yellow ball from 14in step f/b heel raise with UE flexion   Forward Lunges Left;10 reps   Forward Lunges Limitations onto 4" step no UE   Lateral Step Up Left;15 reps;Hand Hold: 1;Step Height: 4"   Forward Step Up Left;15 reps;Hand Hold: 1;Step Height: 6"   Step Down Left;10 reps;Step Height: 4";Hand Hold: 1   Lunge Walking - Round Trips 1RT   Stairs 4RT 7" stairs 1 HHA reciprocally   Rocker Board 2 minutes   Rocker Board Limitations R/L   Other Standing Knee Exercises sidestepping RTB 1RT. balance beam 4 sections fwd/retro 2RT                  PT Short Term Goals -  09/27/14 1211    PT SHORT TERM GOAL #1   Title I HEP   Status Achieved   PT SHORT TERM GOAL #2   Title Pt to be ambulating inside without a cane   Status On-going   PT SHORT TERM GOAL #3   Title Pt to be able to go up and down steps in a reciprocal manner   Status On-going           PT Long Term Goals - 09/27/14 1211    PT LONG TERM GOAL #1   Title I in advance HEP    PT LONG TERM GOAL #2   Title Pt strrength to be wnl to allow pt to sqaut down and come up to be able to work in his yard    Status On-going   PT LONG TERM GOAL #3   Title Pt SLS to 30 seconds B to allow pt to have balance to ambulate on uneven surfaces without cane.   Status Revised   PT LONG TERM GOAL #4   Title Pt to be walking for 30 minutes at least 4 times a week for better health habits.               Plan - 10/03/14 0921     Clinical Impression Statement Progressed to sidestepping with red theraband, balance beam retro and reciprocal stair training.  Pt requires max cues and therapist facilitation for form with all therex.  Pt with noted fatigue in Lt LE toward EOS.  Completed session with nustep with LE utilization only to increase muscular endurance.     PT Next Visit Plan Continue to progress LE strength and endurance while improving balance and overall functional mobility.          Problem List Patient Active Problem List   Diagnosis Date Noted  . CVA (cerebral vascular accident) 09/06/2014  . Left-sided weakness 09/06/2014  . Hyponatremia 09/06/2014  . Non compliance with medical treatment 09/06/2014  . CVA (cerebral infarction) 09/06/2014  . Hypertension   . Diabetes   . Chronic kidney disease   . GERD (gastroesophageal reflux disease)   . Hx of radiation therapy   . Prostate cancer 06/11/2012    Teena Irani, PTA/CLT [phone] 10/03/2014, 9:24 AM  [place] [address] [place], [place], [address] Phone: [phone]   Fax:  [phone]

## 2014-10-05 ENCOUNTER — Ambulatory Visit (HOSPITAL_COMMUNITY): Payer: BLUE CROSS/BLUE SHIELD

## 2014-10-05 DIAGNOSIS — R2689 Other abnormalities of gait and mobility: Secondary | ICD-10-CM

## 2014-10-05 DIAGNOSIS — R29898 Other symptoms and signs involving the musculoskeletal system: Secondary | ICD-10-CM

## 2014-10-05 DIAGNOSIS — R262 Difficulty in walking, not elsewhere classified: Secondary | ICD-10-CM

## 2014-10-05 DIAGNOSIS — I69354 Hemiplegia and hemiparesis following cerebral infarction affecting left non-dominant side: Secondary | ICD-10-CM | POA: Diagnosis not present

## 2014-10-05 NOTE — Therapy (Signed)
[place], [place], [address] Phone: [phone]   Fax:  [phone]  Physical Therapy Treatment  Patient Details  Name: Jim [name] MRN: [id number] Date of Birth: 11/09/1950 Referring Provider:  Petra Kuba, MD  Encounter Date: 10/05/2014      PT End of Session - 10/05/14 0823    Visit Number 5   Number of Visits 8   Date for PT Re-Evaluation 10/15/14   Authorization Type BCBS   PT Start Time 0801   PT Stop Time 0854   PT Time Calculation (min) 53 min   Equipment Utilized During Treatment Gait belt   Activity Tolerance Patient tolerated treatment well   Behavior During Therapy [facility] for tasks assessed/performed      Past Medical History  Diagnosis Date  . Hypertension   . Diabetes mellitus   . Chronic kidney disease     cyst on left kidney   . GERD (gastroesophageal reflux disease)   . Degenerative disc disease, lumbar   . Prostate cancer 06/11/12    gleason 7  . Hx of radiation therapy 12/13/12- 01/27/13    prostate bed 6600 cGy 33 sessions    Past Surgical History  Procedure Laterality Date  . Robot assisted laparoscopic radical prostatectomy  06/11/2012    Procedure: ROBOTIC ASSISTED LAPAROSCOPIC RADICAL PROSTATECTOMY;  Surgeon: Alexis Frock, MD;  Location: [facility];  Service: Urology;  Laterality: N/A;  1st procedure: Robotic Left Renal Cyst Decortication   . Lymphadenectomy  06/11/2012    Procedure: LYMPHADENECTOMY;  Surgeon: Alexis Frock, MD;  Location: [facility];  Service: Urology;  Laterality: Bilateral;  . Robotic assited partial nephrectomy  06/11/2012    Procedure: ROBOTIC ASSITED PARTIAL NEPHRECTOMY;  Surgeon: Alexis Frock, MD;  Location: [facility];  Service: Urology;  Laterality: Left;  . Prostate biopsy  03/25/2012    There were no vitals filed for this visit.  Visit Diagnosis:  Difficulty walking  Unstable balance  Left leg weakness      Subjective Assessment - 10/05/14  0813    Subjective Pt stated he felt stiff today current Lt hip pain scale 2-3/10   Currently in Pain? Yes   Pain Score 3    Pain Location Hip   Pain Orientation Left   Pain Descriptors / Indicators Tightness             [facility] Adult PT Treatment/Exercise - 10/05/14 0001    Exercises   Exercises Knee/Hip   Knee/Hip Exercises: Aerobic   Stationary Bike nustep L4 hills #3 LE only x 15:00    Knee/Hip Exercises: Standing   Heel Raises 15 reps   Heel Raises Limitations squat lifting yellow ball from 14in step f/b heel raise with UE flexion   Forward Lunges Both;10 reps   Forward Lunges Limitations onto 4" step no UE   Side Lunges Both;10 reps   Side Lunges Limitations 4in step   Lateral Step Up Left;15 reps;Hand Hold: 1;Step Height: 4"   Forward Step Up Left;15 reps;Hand Hold: 1;Step Height: 6"   Step Down Left;10 reps;Hand Hold: 1;Step Height: 6"   Lunge Walking - Round Trips 1RT   SLS Lt 10, Rt 13" max of 3   Other Standing Knee Exercises sidestepping RTB 1RT. balance beam 4 sections fwd/retro 2RT               PT Short Term Goals - 10/05/14 1607    PT SHORT TERM GOAL #1   Title I  HEP   Status Achieved   PT SHORT TERM GOAL #2   Title Pt to be ambulating inside without a cane   Status On-going   PT SHORT TERM GOAL #3   Title Pt to be able to go up and down steps in a reciprocal manner   Status On-going           PT Long Term Goals - 10/05/14 6834    PT LONG TERM GOAL #1   Title I in advance HEP    Status On-going   PT LONG TERM GOAL #2   Title Pt strrength to be wnl to allow pt to sqaut down and come up to be able to work in his yard    Status On-going   PT LONG TERM GOAL #3   Title Pt SLS to 30 seconds B to allow pt to have balance to ambulate on uneven surfaces without cane.   PT LONG TERM GOAL #4   Title Pt to be walking for 30 minutes at least 4 times a week for better health habits.   Status On-going               Plan - 10/05/14 1962     Clinical Impression Statement Pt required verbal and tactile cueing and therapist facilitaiotn form proper form with all therex for maximal benefits.  Increased height with step down today with cueing to improve eccentric quad control.  Min to mod assistance required for LOB episodes with dynamic surface balance training to reduce risk of falls.  Continued Nustep with LE utilization to increase musculature endturance, increased time per pt request.     PT Next Visit Plan Continue to progress LE strength and endurance while improving balance and overall functional mobility.  Begin hurdles next session.        Problem List Patient Active Problem List   Diagnosis Date Noted  . CVA (cerebral vascular accident) 09/06/2014  . Left-sided weakness 09/06/2014  . Hyponatremia 09/06/2014  . Non compliance with medical treatment 09/06/2014  . CVA (cerebral infarction) 09/06/2014  . Hypertension   . Diabetes   . Chronic kidney disease   . GERD (gastroesophageal reflux disease)   . Hx of radiation therapy   . Prostate cancer 06/11/2012   Ihor Austin, [place]; [place] #[id number] [phone]  Aldona Lento 10/05/2014, 8:50 AM  [place], [place], [address] Phone: [phone]   Fax:  [phone]

## 2014-10-09 ENCOUNTER — Ambulatory Visit (HOSPITAL_COMMUNITY): Payer: BLUE CROSS/BLUE SHIELD | Admitting: Physical Therapy

## 2014-10-09 DIAGNOSIS — I69354 Hemiplegia and hemiparesis following cerebral infarction affecting left non-dominant side: Secondary | ICD-10-CM | POA: Diagnosis not present

## 2014-10-09 DIAGNOSIS — R29898 Other symptoms and signs involving the musculoskeletal system: Secondary | ICD-10-CM

## 2014-10-09 DIAGNOSIS — R2689 Other abnormalities of gait and mobility: Secondary | ICD-10-CM

## 2014-10-09 DIAGNOSIS — R262 Difficulty in walking, not elsewhere classified: Secondary | ICD-10-CM

## 2014-10-09 NOTE — Therapy (Signed)
[place], [place], [address] Phone: [phone]   Fax:  [phone]  Physical Therapy Treatment  Patient Details  Name: Jim [name] MRN: [id number] Date of Birth: Sep 02, 1950 Referring Provider:  Petra Kuba, MD  Encounter Date: 10/09/2014      PT End of Session - 10/09/14 1143    Visit Number 6   Number of Visits 8   Date for PT Re-Evaluation 10/15/14   Authorization Type BCBS   PT Start Time 1107   PT Stop Time 1200   PT Time Calculation (min) 53 min   Equipment Utilized During Treatment Gait belt   Activity Tolerance Patient tolerated treatment well   Behavior During Therapy [facility] for tasks assessed/performed      Past Medical History  Diagnosis Date  . Hypertension   . Diabetes mellitus   . Chronic kidney disease     cyst on left kidney   . GERD (gastroesophageal reflux disease)   . Degenerative disc disease, lumbar   . Prostate cancer 06/11/12    gleason 7  . Hx of radiation therapy 12/13/12- 01/27/13    prostate bed 6600 cGy 33 sessions    Past Surgical History  Procedure Laterality Date  . Robot assisted laparoscopic radical prostatectomy  06/11/2012    Procedure: ROBOTIC ASSISTED LAPAROSCOPIC RADICAL PROSTATECTOMY;  Surgeon: Alexis Frock, MD;  Location: [facility];  Service: Urology;  Laterality: N/A;  1st procedure: Robotic Left Renal Cyst Decortication   . Lymphadenectomy  06/11/2012    Procedure: LYMPHADENECTOMY;  Surgeon: Alexis Frock, MD;  Location: [facility];  Service: Urology;  Laterality: Bilateral;  . Robotic assited partial nephrectomy  06/11/2012    Procedure: ROBOTIC ASSITED PARTIAL NEPHRECTOMY;  Surgeon: Alexis Frock, MD;  [facility]: [facility];  Service: Urology;  Laterality: Left;  . Prostate biopsy  03/25/2012    There were no vitals filed for this visit.  Visit Diagnosis:  Difficulty walking  Unstable balance  Left leg weakness      Subjective Assessment - 10/09/14  1142    Subjective PT states his lt hip continues to hurt at 3/10.  Pt wtihout any other complaints and reports complaince with HEP.   Currently in Pain? Yes   Pain Score 3    Pain Location Hip   Pain Orientation Left                         [facility] Adult PT Treatment/Exercise - 10/09/14 1217    Knee/Hip Exercises: Aerobic   Stationary Bike nustep L5 hills #3 LE only x 10:00    Knee/Hip Exercises: Standing   Heel Raises 15 reps   Heel Raises Limitations squat lifting yellow ball from 14in step f/b heel raise with UE flexion   Forward Lunges Both;10 reps   Forward Lunges Limitations onto 4" step no UE   Side Lunges Both;10 reps   Side Lunges Limitations 4in step   Lateral Step Up Left;15 reps;Hand Hold: 1;Step Height: 4"   Forward Step Up Left;15 reps;Hand Hold: 1;Step Height: 6"   Step Down Left;10 reps;Hand Hold: 1;Step Height: 6"   Lunge Walking - Round Trips 1RT   SLS Lt 22", Rt 5" max of 3   Other Standing Knee Exercises sidestepping RTB 1RT. balance beam 4 sections fwd/retro 2RT   Other Standing Knee Exercises hurdles 12" /6" 1Rt Rt lead, 1Rt Lt lead  PT Short Term Goals - 10/05/14 4403    PT SHORT TERM GOAL #1   Title I HEP   Status Achieved   PT SHORT TERM GOAL #2   Title Pt to be ambulating inside without a cane   Status On-going   PT SHORT TERM GOAL #3   Title Pt to be able to go up and down steps in a reciprocal manner   Status On-going           PT Long Term Goals - 10/05/14 4742    PT LONG TERM GOAL #1   Title I in advance HEP    Status On-going   PT LONG TERM GOAL #2   Title Pt strrength to be wnl to allow pt to sqaut down and come up to be able to work in his yard    Status On-going   PT LONG TERM GOAL #3   Title Pt SLS to 30 seconds B to allow pt to have balance to ambulate on uneven surfaces without cane.   PT LONG TERM GOAL #4   Title Pt to be walking for 30 minutes at least 4 times a week for better  health habits.   Status On-going               Plan - 10/09/14 1206    Clinical Impression Statement Progressed with hurdles this session.  Pt was able to complete with min assist, however completed in step-to pattern.   Goal will be able to complete reciprocally.  Pt with improved SLS time on Lt today of 22 seconds.  PT required therapist facilitation to complete lunges and squats in correct form.  Pt wtih noted LE weakness at end of session due to muscle fatigue.     PT Next Visit Plan Continue to progress LE strength and endurance while improving balance and overall functional mobility.        Problem List Patient Active Problem List   Diagnosis Date Noted  . CVA (cerebral vascular accident) 09/06/2014  . Left-sided weakness 09/06/2014  . Hyponatremia 09/06/2014  . Non compliance with medical treatment 09/06/2014  . CVA (cerebral infarction) 09/06/2014  . Hypertension   . Diabetes   . Chronic kidney disease   . GERD (gastroesophageal reflux disease)   . Hx of radiation therapy   . Prostate cancer 06/11/2012    Teena Irani, PTA/CLT [phone] 10/09/2014, 12:18 PM  [place] [address] [place], [place], [address] Phone: [phone]   Fax:  [phone]

## 2014-10-11 ENCOUNTER — Ambulatory Visit (HOSPITAL_COMMUNITY): Payer: BLUE CROSS/BLUE SHIELD

## 2014-10-11 DIAGNOSIS — I69354 Hemiplegia and hemiparesis following cerebral infarction affecting left non-dominant side: Secondary | ICD-10-CM | POA: Diagnosis not present

## 2014-10-11 DIAGNOSIS — R2689 Other abnormalities of gait and mobility: Secondary | ICD-10-CM

## 2014-10-11 DIAGNOSIS — R262 Difficulty in walking, not elsewhere classified: Secondary | ICD-10-CM

## 2014-10-11 DIAGNOSIS — R29898 Other symptoms and signs involving the musculoskeletal system: Secondary | ICD-10-CM

## 2014-10-11 NOTE — Therapy (Signed)
[name], [place], [address] Phone: [phone]   Fax:  [phone]  Physical Therapy Treatment  Patient Details  Name: Jim [name] MRN: [id number] Date of Birth: 1950-12-20 Referring Provider:  Petra Kuba, MD  Encounter Date: 10/11/2014      PT End of Session - 10/11/14 1114    Visit Number 7   Number of Visits 8   Date for PT Re-Evaluation 10/15/14   Authorization Type BCBS   PT Start Time 1110   PT Stop Time 1204   PT Time Calculation (min) 54 min   Equipment Utilized During Treatment Gait belt   Activity Tolerance Patient tolerated treatment well   Behavior During Therapy [facility] for tasks assessed/performed      Past Medical History  Diagnosis Date  . Hypertension   . Diabetes mellitus   . Chronic kidney disease     cyst on left kidney   . GERD (gastroesophageal reflux disease)   . Degenerative disc disease, lumbar   . Prostate cancer 06/11/12    gleason 7  . Hx of radiation therapy 12/13/12- 01/27/13    prostate bed 6600 cGy 33 sessions    Past Surgical History  Procedure Laterality Date  . Robot assisted laparoscopic radical prostatectomy  06/11/2012    Procedure: ROBOTIC ASSISTED LAPAROSCOPIC RADICAL PROSTATECTOMY;  Surgeon: Alexis Frock, MD;  Location: [facility];  Service: Urology;  Laterality: N/A;  1st procedure: Robotic Left Renal Cyst Decortication   . Lymphadenectomy  06/11/2012    Procedure: LYMPHADENECTOMY;  Surgeon: Alexis Frock, MD;  Location: [facility];  Service: Urology;  Laterality: Bilateral;  . Robotic assited partial nephrectomy  06/11/2012    Procedure: ROBOTIC ASSITED PARTIAL NEPHRECTOMY;  Surgeon: Alexis Frock, MD;  Location: [facility];  Service: Urology;  Laterality: Left;  . Prostate biopsy  03/25/2012    There were no vitals filed for this visit.  Visit Diagnosis:  Difficulty walking  Unstable balance  Left leg weakness      Subjective Assessment - 10/11/14  1111    Subjective Pt stated his Lt hip continues to hurt, really tight in the morning and achey currently.  Pt feels his balance and strength are improving, continues to have weakness Lt LE   Currently in Pain? Yes   Pain Score 3    Pain Location Hip   Pain Orientation Left   Pain Descriptors / Indicators Aching                         [facility] Adult PT Treatment/Exercise - 10/11/14 0001    Knee/Hip Exercises: Aerobic   Stationary Bike nustep L5 hills #3 LE only x 10:00 SPM > 100   Knee/Hip Exercises: Standing   Heel Raises 15 reps   Heel Raises Limitations squat lifting yellow ball from 14in step f/b heel raise with UE flexion   Forward Lunges Both;10 reps   Forward Lunges Limitations floor no HHA static positions   Side Lunges Both;10 reps   Side Lunges Limitations floor no HHA static positions   Lateral Step Up 15 reps;Hand Hold: 1;Left;Step Height: 4"   Forward Step Up Left;15 reps;Hand Hold: 1;Step Height: 6"   Step Down Left;10 reps;Hand Hold: 1;Step Height: 6"   SLS Lt 15" Rt 13" max of 3   Other Standing Knee Exercises sidestepping RTB 1RT. balance beam 4 sections fwd/retro 2RT   Other Standing Knee Exercises Hurdles alternating 6 and 12in step  thru pattern            PT Short Term Goals - 10/11/14 1114    PT SHORT TERM GOAL #1   Title I HEP   Status Achieved   PT SHORT TERM GOAL #2   Title Pt to be ambulating inside without a cane   Status On-going   PT SHORT TERM GOAL #3   Title Pt to be able to go up and down steps in a reciprocal manner   Status On-going           PT Long Term Goals - 10/11/14 1114    PT LONG TERM GOAL #1   Title I in advance HEP    PT LONG TERM GOAL #2   Title Pt strrength to be wnl to allow pt to sqaut down and come up to be able to work in his yard    Status On-going   PT LONG TERM GOAL #3   Title Pt SLS to 30 seconds B to allow pt to have balance to ambulate on uneven surfaces without cane.   Status On-going   PT  LONG TERM GOAL #4   Title Pt to be walking for 30 minutes at least 4 times a week for better health habits.   Status On-going               Plan - 10/11/14 1146    Clinical Impression Statement Overall functional strengthening are progressing well. Began lunges onto floor for increased loading with no HHA to improve hamsting and gluteal strengthening and balance with no HHA. Progressed to step thru pattern with 6 and 12 with min assistance required for LOB episodes.  Therapist facilitation required to improve form and technique with lunges and squats today.   PT Next Visit Plan Continue to progress LE strength and endurance while improving balance and overall functional mobility.        Problem List Patient Active Problem List   Diagnosis Date Noted  . CVA (cerebral vascular accident) 09/06/2014  . Left-sided weakness 09/06/2014  . Hyponatremia 09/06/2014  . Non compliance with medical treatment 09/06/2014  . CVA (cerebral infarction) 09/06/2014  . Hypertension   . Diabetes   . Chronic kidney disease   . GERD (gastroesophageal reflux disease)   . Hx of radiation therapy   . Prostate cancer 06/11/2012   Jim [name], [place]; [place] #[id number] [phone]  Aldona Lento 10/11/2014, 12:00 PM  [place] [address] [place], [place], [address] Phone: [phone]   Fax:  [phone]

## 2014-10-12 ENCOUNTER — Emergency Department (HOSPITAL_COMMUNITY): Payer: BLUE CROSS/BLUE SHIELD

## 2014-10-12 ENCOUNTER — Inpatient Hospital Stay (HOSPITAL_COMMUNITY): Payer: BLUE CROSS/BLUE SHIELD

## 2014-10-12 ENCOUNTER — Observation Stay (HOSPITAL_COMMUNITY)
Admission: EM | Admit: 2014-10-12 | Discharge: 2014-10-14 | Disposition: A | Discharge status: Home / Self Care | Payer: BLUE CROSS/BLUE SHIELD | Attending: Internal Medicine | Admitting: Internal Medicine

## 2014-10-12 ENCOUNTER — Encounter (HOSPITAL_COMMUNITY): Payer: Self-pay | Admitting: Emergency Medicine

## 2014-10-12 ENCOUNTER — Other Ambulatory Visit: Payer: Self-pay

## 2014-10-12 DIAGNOSIS — I69898 Other sequelae of other cerebrovascular disease: Secondary | ICD-10-CM | POA: Insufficient documentation

## 2014-10-12 DIAGNOSIS — I129 Hypertensive chronic kidney disease with stage 1 through stage 4 chronic kidney disease, or unspecified chronic kidney disease: Secondary | ICD-10-CM | POA: Diagnosis not present

## 2014-10-12 DIAGNOSIS — N183 Chronic kidney disease, stage 3 (moderate): Secondary | ICD-10-CM

## 2014-10-12 DIAGNOSIS — E1165 Type 2 diabetes mellitus with hyperglycemia: Secondary | ICD-10-CM | POA: Diagnosis not present

## 2014-10-12 DIAGNOSIS — Z79899 Other long term (current) drug therapy: Secondary | ICD-10-CM | POA: Diagnosis not present

## 2014-10-12 DIAGNOSIS — N189 Chronic kidney disease, unspecified: Secondary | ICD-10-CM | POA: Diagnosis not present

## 2014-10-12 DIAGNOSIS — Z7982 Long term (current) use of aspirin: Secondary | ICD-10-CM | POA: Insufficient documentation

## 2014-10-12 DIAGNOSIS — Z794 Long term (current) use of insulin: Secondary | ICD-10-CM | POA: Insufficient documentation

## 2014-10-12 DIAGNOSIS — Z923 Personal history of irradiation: Secondary | ICD-10-CM | POA: Insufficient documentation

## 2014-10-12 DIAGNOSIS — R531 Weakness: Secondary | ICD-10-CM | POA: Diagnosis present

## 2014-10-12 DIAGNOSIS — N390 Urinary tract infection, site not specified: Secondary | ICD-10-CM | POA: Diagnosis present

## 2014-10-12 DIAGNOSIS — E119 Type 2 diabetes mellitus without complications: Secondary | ICD-10-CM | POA: Diagnosis not present

## 2014-10-12 DIAGNOSIS — M6289 Other specified disorders of muscle: Secondary | ICD-10-CM

## 2014-10-12 DIAGNOSIS — M5136 Other intervertebral disc degeneration, lumbar region: Secondary | ICD-10-CM | POA: Insufficient documentation

## 2014-10-12 DIAGNOSIS — R4781 Slurred speech: Secondary | ICD-10-CM | POA: Insufficient documentation

## 2014-10-12 DIAGNOSIS — Z7902 Long term (current) use of antithrombotics/antiplatelets: Secondary | ICD-10-CM | POA: Insufficient documentation

## 2014-10-12 DIAGNOSIS — Z8546 Personal history of malignant neoplasm of prostate: Secondary | ICD-10-CM | POA: Diagnosis not present

## 2014-10-12 DIAGNOSIS — G8220 Paraplegia, unspecified: Secondary | ICD-10-CM | POA: Diagnosis not present

## 2014-10-12 DIAGNOSIS — M6281 Muscle weakness (generalized): Secondary | ICD-10-CM | POA: Diagnosis not present

## 2014-10-12 DIAGNOSIS — R319 Hematuria, unspecified: Secondary | ICD-10-CM

## 2014-10-12 DIAGNOSIS — I10 Essential (primary) hypertension: Secondary | ICD-10-CM | POA: Diagnosis not present

## 2014-10-12 DIAGNOSIS — K219 Gastro-esophageal reflux disease without esophagitis: Secondary | ICD-10-CM | POA: Diagnosis not present

## 2014-10-12 HISTORY — DX: Cerebral infarction, unspecified: I63.9

## 2014-10-12 LAB — BASIC METABOLIC PANEL
Anion gap: 7 (ref 5–15)
BUN: 24 mg/dL — AB (ref 6–20)
CHLORIDE: 106 mmol/L (ref 101–111)
CO2: 28 mmol/L (ref 22–32)
Calcium: 9.2 mg/dL (ref 8.9–10.3)
Creatinine, Ser: 1.46 mg/dL — ABNORMAL HIGH (ref 0.61–1.24)
GFR calc Af Amer: 56 mL/min — ABNORMAL LOW (ref >60)
GFR, EST NON AFRICAN AMERICAN: 49 mL/min — AB (ref >60)
GLUCOSE: 79 mg/dL (ref 65–99)
Potassium: 3.7 mmol/L (ref 3.5–5.1)
Sodium: 141 mmol/L (ref 135–145)

## 2014-10-12 LAB — CBC WITH DIFFERENTIAL/PLATELET
BASOS PCT: 0 % (ref 0–1)
Basophils Absolute: 0 10*3/uL (ref 0.0–0.1)
Eosinophils Absolute: 0.3 10*3/uL (ref 0.0–0.7)
Eosinophils Relative: 5 % (ref 0–5)
HEMATOCRIT: 33.7 % — AB (ref 39.0–52.0)
Hemoglobin: 10.9 g/dL — ABNORMAL LOW (ref 13.0–17.0)
Lymphocytes Relative: 17 % (ref 12–46)
Lymphs Abs: 0.9 10*3/uL (ref 0.7–4.0)
MCH: 25.2 pg — ABNORMAL LOW (ref 26.0–34.0)
MCHC: 32.3 g/dL (ref 30.0–36.0)
MCV: 78 fL (ref 78.0–100.0)
MONO ABS: 0.3 10*3/uL (ref 0.1–1.0)
Monocytes Relative: 7 % (ref 3–12)
NEUTROS ABS: 3.7 10*3/uL (ref 1.7–7.7)
Neutrophils Relative %: 71 % (ref 43–77)
Platelets: 228 10*3/uL (ref 150–400)
RBC: 4.32 MIL/uL (ref 4.22–5.81)
RDW: 13.4 % (ref 11.5–15.5)
WBC: 5.2 10*3/uL (ref 4.0–10.5)

## 2014-10-12 LAB — GLUCOSE, CAPILLARY
GLUCOSE-CAPILLARY: 59 mg/dL — AB (ref 65–99)
GLUCOSE-CAPILLARY: 56 mg/dL — AB (ref 65–99)
Glucose-Capillary: 69 mg/dL (ref 65–99)
Glucose-Capillary: 83 mg/dL (ref 65–99)
Glucose-Capillary: 67 mg/dL (ref 65–99)
Glucose-Capillary: 178 mg/dL — ABNORMAL HIGH (ref 65–99)

## 2014-10-12 MED ORDER — ASPIRIN 325 MG PO TABS
325.0000 mg | ORAL_TABLET | Freq: Every day | ORAL | Status: DC
  Administered 2014-10-13 – 2014-10-14 (×2): 325 mg via ORAL
  Filled 2014-10-12 (×2): qty 1

## 2014-10-12 MED ORDER — METOPROLOL SUCCINATE ER 50 MG PO TB24
50.0000 mg | ORAL_TABLET | Freq: Every day | ORAL | Status: DC
  Administered 2014-10-13 – 2014-10-14 (×2): 50 mg via ORAL
  Filled 2014-10-12 (×2): qty 1

## 2014-10-12 MED ORDER — ONDANSETRON HCL 4 MG PO TABS: 4.0000 mg | ORAL_TABLET | Freq: Four times a day (QID) | ORAL | Status: DC | PRN

## 2014-10-12 MED ORDER — AMLODIPINE BESYLATE 5 MG PO TABS
10.0000 mg | ORAL_TABLET | Freq: Every morning | ORAL | Status: DC
  Administered 2014-10-13 – 2014-10-14 (×2): 10 mg via ORAL
  Filled 2014-10-12 (×2): qty 2

## 2014-10-12 MED ORDER — FAMOTIDINE 20 MG PO TABS
20.0000 mg | ORAL_TABLET | Freq: Every evening | ORAL | Status: DC
  Administered 2014-10-12 – 2014-10-13 (×2): 20 mg via ORAL
  Filled 2014-10-12 (×2): qty 1

## 2014-10-12 MED ORDER — INSULIN ASPART 100 UNIT/ML ~~LOC~~ SOLN: 0.0000 [IU] | Freq: Every day | SUBCUTANEOUS | Status: DC

## 2014-10-12 MED ORDER — HEPARIN SODIUM (PORCINE) 5000 UNIT/ML IJ SOLN
5000.0000 [IU] | Freq: Three times a day (TID) | INTRAMUSCULAR | Status: DC
  Administered 2014-10-12 – 2014-10-13 (×2): 5000 [IU] via SUBCUTANEOUS
  Filled 2014-10-12 (×2): qty 1

## 2014-10-12 MED ORDER — GLIPIZIDE ER 5 MG PO TB24: 10.0000 mg | ORAL_TABLET | Freq: Every day | ORAL | Status: DC

## 2014-10-12 MED ORDER — DEXTROSE 50 % IV SOLN
INTRAVENOUS | Status: AC
  Administered 2014-10-12: 50 mL
  Filled 2014-10-12: qty 50

## 2014-10-12 MED ORDER — DEXTROSE 50 % IV SOLN: 25.0000 mL | INTRAVENOUS | Status: DC | PRN

## 2014-10-12 MED ORDER — INSULIN DETEMIR 100 UNIT/ML ~~LOC~~ SOLN
13.0000 [IU] | Freq: Every day | SUBCUTANEOUS | Status: DC
  Filled 2014-10-12: qty 0.13

## 2014-10-12 MED ORDER — CLOPIDOGREL BISULFATE 75 MG PO TABS
75.0000 mg | ORAL_TABLET | Freq: Every day | ORAL | Status: DC
  Administered 2014-10-13 – 2014-10-14 (×2): 75 mg via ORAL
  Filled 2014-10-12 (×2): qty 1

## 2014-10-12 MED ORDER — INSULIN ASPART 100 UNIT/ML ~~LOC~~ SOLN
0.0000 [IU] | Freq: Three times a day (TID) | SUBCUTANEOUS | Status: DC
  Administered 2014-10-14: 3 [IU] via SUBCUTANEOUS

## 2014-10-12 MED ORDER — INSULIN DETEMIR 100 UNIT/ML ~~LOC~~ SOLN
10.0000 [IU] | Freq: Every day | SUBCUTANEOUS | Status: DC
  Filled 2014-10-12 (×4): qty 0.1

## 2014-10-12 MED ORDER — ONDANSETRON HCL 4 MG/2ML IJ SOLN: 4.0000 mg | Freq: Four times a day (QID) | INTRAMUSCULAR | Status: DC | PRN

## 2014-10-12 MED ORDER — ATORVASTATIN CALCIUM 40 MG PO TABS
80.0000 mg | ORAL_TABLET | Freq: Every day | ORAL | Status: DC
  Administered 2014-10-12 – 2014-10-13 (×2): 80 mg via ORAL
  Filled 2014-10-12 (×2): qty 2

## 2014-10-12 MED ORDER — METFORMIN HCL ER 500 MG PO TB24
1000.0000 mg | ORAL_TABLET | Freq: Two times a day (BID) | ORAL | Status: DC
  Administered 2014-10-13: 1000 mg via ORAL
  Filled 2014-10-12 (×8): qty 2

## 2014-10-12 NOTE — ED Notes (Signed)
Report given to [place], RN on 300. Ready to receive patient when MRI is finished.

## 2014-10-12 NOTE — Progress Notes (Signed)
Patient CBG 59, juice given, recheck CBG 69. Patient ate dinner, recheck CBG 56. Patient asymptomatic. Dr. Anastasio Champion notified, no new orders, continue to monitor patient.

## 2014-10-12 NOTE — ED Notes (Signed)
Patient states he was using the commode around 0100. Got to restroom ok and then states he could not get back up and to bed. Wife had to help him. Patient has left sided deficit from previous stroke. States he felt like his right side would not cooperate with him. Right side grips strong, no drift. Able to lift legs without drift. Alert/oriented. Speech is slurred, but patient states this is residual from old stroke.

## 2014-10-12 NOTE — H&P (Signed)
[facility] History and Physical  Jim [name] BJS:[id number] DOB: Sep 18, 1950 DOA: 10/12/2014  Referring physician: ER, Dr. Jeneen Rinks PCP: Petra Kuba, MD   Chief Complaint: Right leg weakness.  HPI: Jim [name] is a 64 y.o. male  This is a 64 year old man who is in this hospital approximately one month ago with this acute CVA mainly right hemispheric, now presents with right leg weakness which started at 1 AM this morning. He denies any arm weakness although he is not quite clear on this history. There is no new speech difficulties. There is no loss of consciousness. He is compliant with taking Plavix and aspirin from discharge last month. Evaluation in the emergency room with MRI brain scan does not show an acute new CVA. He is now being admitted for further management.   Review of Systems:  Apart from symptoms above, all systems negative.  Past Medical History  Diagnosis Date  . Hypertension   . Diabetes mellitus   . Chronic kidney disease     cyst on left kidney   . GERD (gastroesophageal reflux disease)   . Degenerative disc disease, lumbar   . Prostate cancer 06/11/12    gleason 7  . Hx of radiation therapy 12/13/12- 01/27/13    prostate bed 6600 cGy 33 sessions  . Stroke    Past Surgical History  Procedure Laterality Date  . Robot assisted laparoscopic radical prostatectomy  06/11/2012    Procedure: ROBOTIC ASSISTED LAPAROSCOPIC RADICAL PROSTATECTOMY;  Surgeon: Alexis Frock, MD;  Location: [facility];  Service: Urology;  Laterality: N/A;  1st procedure: Robotic Left Renal Cyst Decortication   . Lymphadenectomy  06/11/2012    Procedure: LYMPHADENECTOMY;  Surgeon: Alexis Frock, MD;  Location: [facility];  Service: Urology;  Laterality: Bilateral;  . Robotic assited partial nephrectomy  06/11/2012    Procedure: ROBOTIC ASSITED PARTIAL NEPHRECTOMY;  Surgeon: Alexis Frock, MD;  Location: [facility];  Service: Urology;  Laterality: Left;  . Prostate biopsy  03/25/2012    Social History:  reports that he has never smoked. He does not have any smokeless tobacco history on file. He reports that he does not drink alcohol or use illicit drugs.  No Known Allergies  Family History  Problem Relation Age of Onset  . Cancer Mother     pancreatic  . Cancer Father     lung  . Cancer Brother     prostate-surgery 6 years ago    Prior to Admission medications   Medication Sig Start Date End Date Taking? Authorizing Provider  amLODipine (NORVASC) 10 MG tablet Take 1 tablet (10 mg total) by mouth every morning. 09/08/14  Yes Orvan Falconer, MD  aspirin 325 MG tablet Take 325 mg by mouth daily.   Yes Historical Provider, MD  atorvastatin (LIPITOR) 80 MG tablet Take 1 tablet (80 mg total) by mouth daily at 6 PM. 09/08/14  Yes Orvan Falconer, MD  clopidogrel (PLAVIX) 75 MG tablet Take 1 tablet (75 mg total) by mouth daily. 09/07/14  Yes Lezlie Octave Black, NP  glipiZIDE (GLUCOTROL XL) 10 MG 24 hr tablet Take 1 tablet (10 mg total) by mouth every morning. 09/07/14  Yes Lezlie Octave Black, NP  insulin detemir (LEVEMIR) 100 UNIT/ML injection Inject 0.13 mLs (13 Units total) into the skin daily. 09/07/14  Yes Lezlie Octave Black, NP  metFORMIN (GLUCOPHAGE-XR) 500 MG 24 hr tablet Take 2 tablets (1,000 mg total) by mouth 2 (two) times daily. Patient taking differently: Take 500 mg by mouth 2 (two) times daily.  09/08/14  Yes Orvan Falconer, MD  metoprolol succinate (TOPROL-XL) 50 MG 24 hr tablet Take 1 tablet (50 mg total) by mouth daily. 09/08/14  Yes Orvan Falconer, MD  ranitidine (ZANTAC) 150 MG capsule Take 1 capsule by mouth 2 (two) times daily as needed for heartburn.  09/14/14  Yes Historical Provider, MD   Physical Exam: Filed Vitals:   10/12/14 1231 10/12/14 1415 10/12/14 1515 10/12/14 1530  BP: 142/67  140/80 161/76  Pulse: 65 69 68 70  Temp:    98.3 F (36.8 C)  TempSrc:    Oral  Resp: 12 15 18 18   Height:    5\' 8"  (1.727 m)  Weight:    87.635 kg (193 lb 3.2 oz)  SpO2: 99% 97% 100% 100%    Wt  Readings from Last 3 Encounters:  10/12/14 87.635 kg (193 lb 3.2 oz)  10/12/14 86.183 kg (190 lb)  09/06/14 88.451 kg (195 lb)    General:  Appears alert and orientated. Eyes: PERRL, normal lids, irises & conjunctiva ENT: grossly normal hearing, lips & tongue Neck: no LAD, masses or thyromegaly Cardiovascular: RRR, no m/r/g. No LE edema. Telemetry: SR, no arrhythmias  Respiratory: CTA bilaterally, no w/r/r. Normal respiratory effort. Abdomen: soft, ntnd Skin: no rash or induration seen on limited exam Musculoskeletal: grossly normal tone BUE/BLE Psychiatric: grossly normal mood and affect, speech fluent and appropriate Neurologic: grossly non-focal. By my exam, there is no clear right leg weakness.           Labs on Admission:  Basic Metabolic Panel:  Recent Labs Lab 10/12/14 1051  NA 141  K 3.7  CL 106  CO2 28  GLUCOSE 79  BUN 24*  CREATININE 1.46*  CALCIUM 9.2   Liver Function Tests: No results for input(s): AST, ALT, ALKPHOS, BILITOT, PROT, ALBUMIN in the last 168 hours. No results for input(s): LIPASE, AMYLASE in the last 168 hours. No results for input(s): AMMONIA in the last 168 hours. CBC:  Recent Labs Lab 10/12/14 1051  WBC 5.2  NEUTROABS 3.7  HGB 10.9*  HCT 33.7*  MCV 78.0  PLT 228   Cardiac Enzymes: No results for input(s): CKTOTAL, CKMB, CKMBINDEX, TROPONINI in the last 168 hours.  BNP (last 3 results) No results for input(s): BNP in the last 8760 hours.  ProBNP (last 3 results) No results for input(s): PROBNP in the last 8760 hours.  CBG: No results for input(s): GLUCAP in the last 168 hours.  Radiological Exams on Admission: Ct Head Wo Contrast  10/12/2014   CLINICAL DATA:  Left-sided deficit from prior stroke. Slurred speech. Right-sided weakness.  EXAM: CT HEAD WITHOUT CONTRAST  TECHNIQUE: Contiguous axial images were obtained from the base of the skull through the vertex without intravenous contrast.  COMPARISON:  MR brain 09/06/2014   FINDINGS: There is no evidence of mass effect, midline shift, or extra-axial fluid collections. There is no evidence of a space-occupying lesion or intracranial hemorrhage. There is no evidence of a cortical-based area of acute infarction. There is an old left external capsule lacunar infarct. There an old right pontine lacunar infarct. There is periventricular white matter low attenuation likely secondary to microangiopathy.  The ventricles and sulci are appropriate for the patient's age. The basal cisterns are patent.  Visualized portions of the orbits are unremarkable. The visualized portions of the paranasal sinuses and mastoid air cells are unremarkable. Cerebrovascular atherosclerotic calcifications are noted.  The osseous structures are unremarkable.  IMPRESSION: No acute intracranial pathology.   Electronically  Signed   By: Kathreen Devoid   On: 10/12/2014 12:33   Mr Brain Wo Contrast  10/12/2014   CLINICAL DATA:  64 year old male with previous infarct with residual left side weakness. Brainstem infarcts in April. Increased right side versus generalized weakness at 0100 hours, could not get back to bed from restroom. Initial encounter.  EXAM: MRI HEAD WITHOUT CONTRAST  TECHNIQUE: Multiplanar, multiecho pulse sequences of the brain and surrounding structures were obtained without intravenous contrast.  COMPARISON:  Head CT without contrast 1201 hours today. Brain MRI 09/06/2014.  FINDINGS: Major intracranial vascular flow voids are stable. Residual heterogeneous diffusion signal in the right paracentral pons appears to be evolution of the changes in April. No new restricted diffusion identified. No associated mass effect.  Elsewhere Stable T2 weighted imaging appearance of the brain with confluent cerebral white matter signal abnormality and chronic left corona radiata lacunar infarct tracking to the external capsule. Stable right operculum white matter hemosiderin. No midline shift, mass effect, evidence of  mass lesion, ventriculomegaly, extra-axial collection or acute intracranial hemorrhage. Cervicomedullary junction and pituitary are within normal limits. Stable visualized cervical spine. Small focus of cortical encephalomalacia in the left posterior MCA territory is stable (series 7, image 18). No new signal abnormality identified.  Visible internal auditory structures appear normal. Stable paranasal sinuses and mastoids. Stable orbit and scalp soft tissues. Normal bone marrow signal.  IMPRESSION: 1. Expected evolution of right brainstem infarct which occurred last month. No new intracranial abnormality. 2. Severe underlying chronic primarily small vessel ischemia.   Electronically Signed   By: Genevie Ann M.D.   On: 10/12/2014 15:20     Assessment/Plan   1. Right leg weakness. MRI does not show an acute CVA but some chronic changes. We will continue aspirin and Plavix. I will request neurology consultation. I will also request physical therapy consultation. 2. Hypertension. Stable. 3. Diabetes. Continue with home medications and sliding scale insulin. 4. Chronic kidney disease. Stable.  Further recommendations will depend on patient's hospital progress.  Code Status: Full code.  DVT Prophylaxis: Heparin.  Family Communication: I discussed the plan with the patient and his wife at the bedside.   Disposition Plan: Depending on progress.  Time spent: 45 minutes.  Doree Albee [facility] Pager [phone].

## 2014-10-12 NOTE — ED Provider Notes (Signed)
CSN: [id number]     Arrival date & time 10/12/14  1037 History   First MD Initiated Contact with Patient 10/12/14 1055     Chief Complaint  Patient presents with  . Weakness      HPI  Pt presents stating that he has been weak with  His Right side since 1:00am.  He was able to get to BR with wife's assist at 1am.  Had to have help getting to room next to BR.  However, this morning he could not get from the other room, back to his bedroom even with her assist.  Patient has a history of previous right hemispheric CVA with left facial, arm, and leg deficits. Normally is able to ambulate around his home using the assistance only a cane. He is compliant with Plavix, and aspirin per his report.   Past Medical History  Diagnosis Date  . Hypertension   . Diabetes mellitus   . Chronic kidney disease     cyst on left kidney   . GERD (gastroesophageal reflux disease)   . Degenerative disc disease, lumbar   . Prostate cancer 06/11/12    gleason 7  . Hx of radiation therapy 12/13/12- 01/27/13    prostate bed 6600 cGy 33 sessions  . Stroke    Past Surgical History  Procedure Laterality Date  . Robot assisted laparoscopic radical prostatectomy  06/11/2012    Procedure: ROBOTIC ASSISTED LAPAROSCOPIC RADICAL PROSTATECTOMY;  Surgeon: Alexis Frock, MD;  Location: [facility];  Service: Urology;  Laterality: N/A;  1st procedure: Robotic Left Renal Cyst Decortication   . Lymphadenectomy  06/11/2012    Procedure: LYMPHADENECTOMY;  Surgeon: Alexis Frock, MD;  Location: [facility];  Service: Urology;  Laterality: Bilateral;  . Robotic assited partial nephrectomy  06/11/2012    Procedure: ROBOTIC ASSITED PARTIAL NEPHRECTOMY;  Surgeon: Alexis Frock, MD;  Location: [facility];  Service: Urology;  Laterality: Left;  . Prostate biopsy  03/25/2012   Family History  Problem Relation Age of Onset  . Cancer Mother     pancreatic  . Cancer Father     lung  . Cancer Brother     prostate-surgery 6 years ago   History   Substance Use Topics  . Smoking status: Never Smoker   . Smokeless tobacco: Not on file  . Alcohol Use: No     Comment: no use in 1 year per patient     Review of Systems  Constitutional: Negative for fever, chills, diaphoresis, appetite change and fatigue.  HENT: Negative for mouth sores, sore throat and trouble swallowing.   Eyes: Negative for visual disturbance.  Respiratory: Negative for cough, chest tightness, shortness of breath and wheezing.   Cardiovascular: Negative for chest pain.  Gastrointestinal: Negative for nausea, vomiting, abdominal pain, diarrhea and abdominal distention.  Endocrine: Negative for polydipsia, polyphagia and polyuria.  Genitourinary: Negative for dysuria, frequency and hematuria.  Musculoskeletal: Negative for gait problem.  Skin: Negative for color change, pallor and rash.  Neurological: Positive for weakness. Negative for dizziness, syncope, light-headedness and headaches.  Hematological: Does not bruise/bleed easily.  Psychiatric/Behavioral: Negative for behavioral problems and confusion.      Allergies  Review of patient's allergies indicates no known allergies.  Home Medications   Prior to Admission medications   Medication Sig Start Date End Date Taking? Authorizing Provider  amLODipine (NORVASC) 10 MG tablet Take 1 tablet (10 mg total) by mouth every morning. 09/08/14  Yes Orvan Falconer, MD  aspirin 325 MG tablet Take 325  mg by mouth daily.   Yes Historical Provider, MD  atorvastatin (LIPITOR) 80 MG tablet Take 1 tablet (80 mg total) by mouth daily at 6 PM. 09/08/14  Yes Orvan Falconer, MD  clopidogrel (PLAVIX) 75 MG tablet Take 1 tablet (75 mg total) by mouth daily. 09/07/14  Yes Lezlie Octave Black, NP  glipiZIDE (GLUCOTROL XL) 10 MG 24 hr tablet Take 1 tablet (10 mg total) by mouth every morning. 09/07/14  Yes Lezlie Octave Black, NP  insulin detemir (LEVEMIR) 100 UNIT/ML injection Inject 0.13 mLs (13 Units total) into the skin daily. 09/07/14  Yes Lezlie Octave Black,  NP  metFORMIN (GLUCOPHAGE-XR) 500 MG 24 hr tablet Take 2 tablets (1,000 mg total) by mouth 2 (two) times daily. Patient taking differently: Take 500 mg by mouth 2 (two) times daily.  09/08/14  Yes Orvan Falconer, MD  metoprolol succinate (TOPROL-XL) 50 MG 24 hr tablet Take 1 tablet (50 mg total) by mouth daily. 09/08/14  Yes Orvan Falconer, MD  ranitidine (ZANTAC) 150 MG capsule Take 1 capsule by mouth 2 (two) times daily as needed for heartburn.  09/14/14  Yes Historical Provider, MD   BP 142/67 mmHg  Pulse 65  Temp(Src) 98.1 F (36.7 C) (Oral)  Resp 12  Ht 5\' 8"  (1.727 m)  Wt 190 lb (86.183 kg)  BMI 28.90 kg/m2  SpO2 99% Physical Exam  Neurological:  Cannot weight bear on RLE to stand.    ED Course  Procedures (including critical care time) Labs Review Labs Reviewed  CBC WITH DIFFERENTIAL/PLATELET - Abnormal; Notable for the following:    Hemoglobin 10.9 (*)    HCT 33.7 (*)    MCH 25.2 (*)    All other components within normal limits  BASIC METABOLIC PANEL - Abnormal; Notable for the following:    BUN 24 (*)    Creatinine, Ser 1.46 (*)    GFR calc non Af Amer 49 (*)    GFR calc Af Amer 56 (*)    All other components within normal limits    Imaging Review Ct Head Wo Contrast  10/12/2014   CLINICAL DATA:  Left-sided deficit from prior stroke. Slurred speech. Right-sided weakness.  EXAM: CT HEAD WITHOUT CONTRAST  TECHNIQUE: Contiguous axial images were obtained from the base of the skull through the vertex without intravenous contrast.  COMPARISON:  MR brain 09/06/2014  FINDINGS: There is no evidence of mass effect, midline shift, or extra-axial fluid collections. There is no evidence of a space-occupying lesion or intracranial hemorrhage. There is no evidence of a cortical-based area of acute infarction. There is an old left external capsule lacunar infarct. There an old right pontine lacunar infarct. There is periventricular white matter low attenuation likely secondary to microangiopathy.   The ventricles and sulci are appropriate for the patient's age. The basal cisterns are patent.  Visualized portions of the orbits are unremarkable. The visualized portions of the paranasal sinuses and mastoid air cells are unremarkable. Cerebrovascular atherosclerotic calcifications are noted.  The osseous structures are unremarkable.  IMPRESSION: No acute intracranial pathology.   Electronically Signed   By: Kathreen Devoid   On: 10/12/2014 12:33     EKG Interpretation None      MDM   Final diagnoses:  Right sided weakness    No acute abdomen on recent CT. Patient without obvious frank deficits on exam. However, he is not able to bear weight with his right leg. Normally he is able to bear weight fully with his right leg and  walk with assistance of only pain in his right hand. At the bedside he cannot stand with my assistance, using the cane, and his weight on his right leg. I discussed the case with Dr.Gosrani of [facility]. Patient is to be admitted. MRI has been ordered. He'll need physical therapy evaluation.    Tanna Furry, MD 10/12/14 [phone]

## 2014-10-12 NOTE — ED Notes (Signed)
Patient passed swallow screen without difficulty. Given lunch tray with MD permission.

## 2014-10-12 NOTE — ED Notes (Signed)
Patient in MRI 

## 2014-10-13 ENCOUNTER — Observation Stay (HOSPITAL_COMMUNITY): Payer: BLUE CROSS/BLUE SHIELD

## 2014-10-13 DIAGNOSIS — M6289 Other specified disorders of muscle: Secondary | ICD-10-CM | POA: Diagnosis not present

## 2014-10-13 DIAGNOSIS — N281 Cyst of kidney, acquired: Secondary | ICD-10-CM | POA: Diagnosis not present

## 2014-10-13 DIAGNOSIS — E119 Type 2 diabetes mellitus without complications: Secondary | ICD-10-CM | POA: Diagnosis not present

## 2014-10-13 DIAGNOSIS — I10 Essential (primary) hypertension: Secondary | ICD-10-CM | POA: Diagnosis not present

## 2014-10-13 DIAGNOSIS — R310 Gross hematuria: Secondary | ICD-10-CM

## 2014-10-13 DIAGNOSIS — R911 Solitary pulmonary nodule: Secondary | ICD-10-CM | POA: Diagnosis not present

## 2014-10-13 DIAGNOSIS — Z9079 Acquired absence of other genital organ(s): Secondary | ICD-10-CM | POA: Diagnosis not present

## 2014-10-13 DIAGNOSIS — N3001 Acute cystitis with hematuria: Secondary | ICD-10-CM | POA: Diagnosis not present

## 2014-10-13 LAB — URINALYSIS, ROUTINE W REFLEX MICROSCOPIC
Glucose, UA: 100 mg/dL — AB
KETONES UR: 15 mg/dL — AB
Nitrite: POSITIVE — AB
PH: 6.5 (ref 5.0–8.0)
Protein, ur: >300 mg/dL — AB
SPECIFIC GRAVITY, URINE: 1.025 (ref 1.005–1.030)
Urobilinogen, UA: 4 mg/dL — ABNORMAL HIGH (ref 0.0–1.0)

## 2014-10-13 LAB — GLUCOSE, CAPILLARY
GLUCOSE-CAPILLARY: 75 mg/dL (ref 65–99)
GLUCOSE-CAPILLARY: 87 mg/dL (ref 65–99)
Glucose-Capillary: 138 mg/dL — ABNORMAL HIGH (ref 65–99)
Glucose-Capillary: 105 mg/dL — ABNORMAL HIGH (ref 65–99)
Glucose-Capillary: 66 mg/dL (ref 65–99)
Glucose-Capillary: 109 mg/dL — ABNORMAL HIGH (ref 65–99)

## 2014-10-13 LAB — CBC
HCT: 34 % — ABNORMAL LOW (ref 39.0–52.0)
HEMOGLOBIN: 10.8 g/dL — AB (ref 13.0–17.0)
MCH: 24.6 pg — ABNORMAL LOW (ref 26.0–34.0)
MCHC: 31.8 g/dL (ref 30.0–36.0)
MCV: 77.4 fL — ABNORMAL LOW (ref 78.0–100.0)
Platelets: 237 10*3/uL (ref 150–400)
RBC: 4.39 MIL/uL (ref 4.22–5.81)
RDW: 13.4 % (ref 11.5–15.5)
WBC: 4.8 10*3/uL (ref 4.0–10.5)

## 2014-10-13 LAB — URINE MICROSCOPIC-ADD ON

## 2014-10-13 LAB — COMPREHENSIVE METABOLIC PANEL
ALT: 16 U/L — AB (ref 17–63)
ANION GAP: 7 (ref 5–15)
AST: 19 U/L (ref 15–41)
Albumin: 3.7 g/dL (ref 3.5–5.0)
Alkaline Phosphatase: 47 U/L (ref 38–126)
BUN: 21 mg/dL — ABNORMAL HIGH (ref 6–20)
CALCIUM: 9.1 mg/dL (ref 8.9–10.3)
CO2: 27 mmol/L (ref 22–32)
Chloride: 107 mmol/L (ref 101–111)
Creatinine, Ser: 1.45 mg/dL — ABNORMAL HIGH (ref 0.61–1.24)
GFR calc non Af Amer: 49 mL/min — ABNORMAL LOW (ref >60)
GFR, EST AFRICAN AMERICAN: 57 mL/min — AB (ref >60)
GLUCOSE: 70 mg/dL (ref 65–99)
POTASSIUM: 3.8 mmol/L (ref 3.5–5.1)
Sodium: 141 mmol/L (ref 135–145)
TOTAL PROTEIN: 6.6 g/dL (ref 6.5–8.1)
Total Bilirubin: 0.5 mg/dL (ref 0.3–1.2)

## 2014-10-13 NOTE — Clinical Social Work Note (Signed)
Pt accepts bed at [facility]. Facility aware and has started authorization. Discussed with pt that authorization will not likely be received today and pt may d/c over weekend. He is aware but is not interested in facilities outside of county. He plans to return home if ready on weekend and will follow up with [facility]. Facility is willing to accept pt from home once authorization received.   Benay Pike, [place]

## 2014-10-13 NOTE — Progress Notes (Signed)
Inpatient Diabetes Program Recommendations  AACE/ADA: New Consensus Statement on Inpatient Glycemic Control (2013)  Target Ranges:  Prepandial:   less than 140 mg/dL      Peak postprandial:   less than 180 mg/dL (1-2 hours)      Critically ill patients:  140 - 180 mg/dL   Reason for Assessment:  Results for Jim [name], Jim [name] (MRN [id number]) as of 10/13/2014 10:11  Ref. Range 10/12/2014 19:01 10/12/2014 20:53 10/12/2014 21:42 10/13/2014 07:44 10/13/2014 09:31  Glucose-Capillary Latest Ref Range: 65-99 mg/dL 83 67 178 (H) 75 138 (H)   Diabetes history: Type 2 diabetes Outpatient Diabetes medications: Levemir 13 units daily, Glipizide XL 10 mg daily, Metformin 1000 mg bid Current orders for Inpatient glycemic control:  Levemir 10 units daily, Metformin XR 1000 mg bid, Novolog moderate tid with meals and HS  May consider reducing Levemir to 5 units daily.  Thanks, Adah Perl, RN, BC-ADM Inpatient Diabetes Coordinator Pager [phone] (8a-5p)

## 2014-10-13 NOTE — Clinical Social Work Note (Signed)
Clinical Social Work Assessment  Patient Details  Name: Jim [name] MRN: [id number] Date of Birth: 07/05/1950  Date of referral:  10/13/14               Reason for consult:  Facility Placement                Permission sought to share information with:  Family Supports Permission granted to share information::  Yes, Verbal Permission Granted  Name::     Linda  Agency::     Relationship::  Spouse  Contact Information:     Housing/Transportation Living arrangements for the past 2 months:  Single Family Home Source of Information:  Patient, Spouse Patient Interpreter Needed:  None Criminal Activity/Legal Involvement Pertinent to Current Situation/Hospitalization:  No - Comment as needed Significant Relationships:  Spouse Lives with:  Spouse Do you feel safe going back to the place where you live?   (Pt feels that it would be best to go to rehab prior to return home. ) Need for family participation in patient care:  Yes (Comment)  Care giving concerns:  Pt is alone while his wife works from 4 pm-1 am.   Social Worker assessment / plan:  CSW met with pt and pt's wife at bedside. Pt alert and oriented and reports that he was here about a month ago for a stroke. He returned home and has been going to outpatient PT two times a week. Pt indicates that he has not improved at all and he is becoming more concerned. He has not driven since his stroke. His wife needs to have a shoulder operation soon. PT evaluated pt this morning and recommend SNF. CSW discussed placement process including insurance authorization as pt states [employer] is primary. He would prefer [place] or [place]. SNF list provided.   Employment status:  Disabled (Comment on whether or not currently receiving Disability) Insurance information:  Managed Care PT Recommendations:  Skilled Nursing Facility Information / Referral to community resources:  Skilled Nursing Facility  Patient/Family's Response to care:  Pt agrees  that SNF would be beneficial.   Patient/Family's Understanding of and Emotional Response to Diagnosis, Current Treatment, and Prognosis:  Pt understands diagnosis and treatment. He indicates that he his functional status has actually declined since he has been at home. He admits that this has been a hard adjustment as he was independent prior to stroke. Brief support provided.   Emotional Assessment Appearance:  Appears stated age Attitude/Demeanor/Rapport:  Other (Cooperative) Affect (typically observed):  Appropriate Orientation:  Oriented to Self, Oriented to Place, Oriented to Situation, Oriented to  Time Alcohol / Substance use:  Not Applicable Psych involvement (Current and /or in the community):  No (Comment)  Discharge Needs  Concerns to be addressed:  Discharge Planning Concerns Readmission within the last 30 days:  No Current discharge risk:  Physical Impairment Barriers to Discharge:  Continued Medical Work up   Stultz, Kara [place], LCSW 10/13/2014, 10:39 AM [phone] 

## 2014-10-13 NOTE — Progress Notes (Signed)
Pt voided 500 cc visible bright red blood. Dr.David made aware. No new orders at this time. Will continue to monitor.

## 2014-10-13 NOTE — Clinical Social Work Placement (Signed)
   CLINICAL SOCIAL WORK PLACEMENT  NOTE  Date:  10/13/2014  Patient Details  Name: Jt Brabec MRN: [id number] Date of Birth: 08/17/1950  Clinical Social Work is seeking post-discharge placement for this patient at the [place] level of care (*CSW will initial, date and re-position this form in  chart as items are completed):  Yes   Patient/family provided with [place] Work Department's list of facilities offering this level of care within the geographic area requested by the patient (or if unable, by the patient's family).  Yes   Patient/family informed of their freedom to choose among providers that offer the needed level of care, that participate in Medicare, Medicaid or managed care program needed by the patient, have an available bed and are willing to accept the patient.  Yes   Patient/family informed of [facility]'s ownership interest in [facility] and [facility], as well as of the fact that they are under no obligation to receive care at these facilities.  PASRR submitted to EDS on 10/13/14     PASRR number received on 10/13/14     Existing PASRR number confirmed on       FL2 transmitted to all facilities in geographic area requested by pt/family on 10/13/14     FL2 transmitted to all facilities within larger geographic area on       Patient informed that his/her managed care company has contracts with or will negotiate with certain facilities, including the following:        Yes   Patient/family informed of bed offers received.  Patient chooses bed at [facility]     Physician recommends and patient chooses bed at      Patient to be transferred to   on  .  Patient to be transferred to facility by       Patient family notified on   of transfer.  Name of family member notified:        PHYSICIAN       Additional Comment:    _______________________________________________ Salome Arnt,  LCSW 10/13/2014, 2:32 PM [phone]

## 2014-10-13 NOTE — Clinical Social Work Note (Signed)
Update: [facility] received authorization. Anticipate possible d/c Saturday per MD. CSW attempted to notify pt, but not in room. Left voicemail for wife.  Benay Pike, [place]

## 2014-10-13 NOTE — Progress Notes (Signed)
Hypoglycemic Event  CBG: 66  Treatment:  snack provide  Symptoms:  none  Follow-up CBG: Time:1755 CBG Result: 109  Possible Reasons for Event:   Comments/MD notified:Informed Dr. Roderic Palau metformin held    Jim [name], [name]  Remember to initiate Hypoglycemia Order Set & complete

## 2014-10-13 NOTE — Clinical Social Work Placement (Signed)
   CLINICAL SOCIAL WORK PLACEMENT  NOTE  Date:  10/13/2014  Patient Details  Name: Jim [name] MRN: [id number] Date of Birth: 1950-07-11  Clinical Social Work is seeking post-discharge placement for this patient at the [place] level of care (*CSW will initial, date and re-position this form in  chart as items are completed):  Yes   Patient/family provided with [facility] Department's list of facilities offering this level of care within the geographic area requested by the patient (or if unable, by the patient's family).  Yes   Patient/family informed of their freedom to choose among providers that offer the needed level of care, that participate in Medicare, Medicaid or managed care program needed by the patient, have an available bed and are willing to accept the patient.  Yes   Patient/family informed of [facility]'s ownership interest in [facility] and [facility], as well as of the fact that they are under no obligation to receive care at these facilities.  PASRR submitted to EDS on 10/13/14     PASRR number received on 10/13/14     Existing PASRR number confirmed on       FL2 transmitted to all facilities in geographic area requested by pt/family on 10/13/14     FL2 transmitted to all facilities within larger geographic area on       Patient informed that his/her managed care company has contracts with or will negotiate with certain facilities, including the following:            Patient/family informed of bed offers received.  Patient chooses bed at       Physician recommends and patient chooses bed at      Patient to be transferred to   on  .  Patient to be transferred to facility by       Patient family notified on   of transfer.  Name of family member notified:        PHYSICIAN       Additional Comment:    _______________________________________________ Salome Arnt, [place] 10/13/2014, 10:33  AM [phone]

## 2014-10-13 NOTE — Progress Notes (Signed)
Patient found sitting in the floor in front of chair. Patient states he got himself up out of the bed to chair and then he tried to fix his bed covers and knee gave out. Notified Dr. Roderic Palau and patient's wife via telephone. No injury noted. Patient denies any c/o pain.

## 2014-10-13 NOTE — Progress Notes (Signed)
[facility] PROGRESS NOTE  Jim [name] ZOX:[id number] DOB: 1950-11-22 DOA: 10/12/2014 PCP: Petra Kuba, MD  Assessment/Plan: 1. Right leg weakness. Appears to have resolved. MRI did not show any acute findings. Patient is already on aspirin and Plavix. Neurology has been consulted. 2. Hypertension. Stable 3. Diabetes. Patient is having episodes of hypoglycemia. Continue sliding scale insulin and Levemir. Hold metformin and glipizide for now. 4. Chronic disease stage III. Creatinine is currently stable. Continue to monitor. 5. Gross hematuria. Patient does not have any abdominal discomfort. He is not febrile and does not have a leukocytosis. Will check CT of the abdomen and pelvis to rule out any stones/bladder mass. Also check urinalysis. 6. History of prostate cancer status post prostatectomy. Followed by urology as an outpatient. Recently told that PSA was undetectable. 7. History of stroke in the past with residual left-sided weakness. Appears to be at baseline.  Code Status: full code Family Communication: discussed with wife a the bedside Disposition Plan: discharge to SNF when improved   Consultants:    Procedures:    Antibiotics:    HPI/Subjective: Still having hematuria. Right lower extremity weakness improved. Has residual left sided weakness in upper and lower extremities from old stroke.  Objective: Filed Vitals:   10/13/14 0625  BP: 165/66  Pulse: 72  Temp: 98.2 F (36.8 C)  Resp: 17    Intake/Output Summary (Last 24 hours) at 10/13/14 1516 Last data filed at 10/13/14 1200  Gross per 24 hour  Intake    840 ml  Output      0 ml  Net    840 ml   Filed Weights   10/12/14 1043 10/12/14 1530  Weight: 86.183 kg (190 lb) 87.635 kg (193 lb 3.2 oz)    Exam:   General:  NAD  Cardiovascular: S1, S2 RRR  Respiratory: CTA B  Abdomen: soft, nt, nd, bs+  Musculoskeletal: no edema b/l  Neuro: 5/5 strength in right upper and lower  extremities, 3/5 in left upper and lower extremities   Data Reviewed: Basic Metabolic Panel:  Recent Labs Lab 10/12/14 1051 10/13/14 0557  NA 141 141  K 3.7 3.8  CL 106 107  CO2 28 27  GLUCOSE 79 70  BUN 24* 21*  CREATININE 1.46* 1.45*  CALCIUM 9.2 9.1   Liver Function Tests:  Recent Labs Lab 10/13/14 0557  AST 19  ALT 16*  ALKPHOS 47  BILITOT 0.5  PROT 6.6  ALBUMIN 3.7   No results for input(s): LIPASE, AMYLASE in the last 168 hours. No results for input(s): AMMONIA in the last 168 hours. CBC:  Recent Labs Lab 10/12/14 1051 10/13/14 0557  WBC 5.2 4.8  NEUTROABS 3.7  --   HGB 10.9* 10.8*  HCT 33.7* 34.0*  MCV 78.0 77.4*  PLT 228 237   Cardiac Enzymes: No results for input(s): CKTOTAL, CKMB, CKMBINDEX, TROPONINI in the last 168 hours. BNP (last 3 results) No results for input(s): BNP in the last 8760 hours.  ProBNP (last 3 results) No results for input(s): PROBNP in the last 8760 hours.  CBG:  Recent Labs Lab 10/12/14 2053 10/12/14 2142 10/13/14 0744 10/13/14 0931 10/13/14 1122  GLUCAP 67 178* 75 138* 105*    No results found for this or any previous visit (from the past 240 hour(s)).   Studies: Ct Head Wo Contrast  10/12/2014   CLINICAL DATA:  Left-sided deficit from prior stroke. Slurred speech. Right-sided weakness.  EXAM: CT HEAD WITHOUT CONTRAST  TECHNIQUE: Contiguous axial images were  obtained from the base of the skull through the vertex without intravenous contrast.  COMPARISON:  MR brain 09/06/2014  FINDINGS: There is no evidence of mass effect, midline shift, or extra-axial fluid collections. There is no evidence of a space-occupying lesion or intracranial hemorrhage. There is no evidence of a cortical-based area of acute infarction. There is an old left external capsule lacunar infarct. There an old right pontine lacunar infarct. There is periventricular white matter low attenuation likely secondary to microangiopathy.  The ventricles and  sulci are appropriate for the patient's age. The basal cisterns are patent.  Visualized portions of the orbits are unremarkable. The visualized portions of the paranasal sinuses and mastoid air cells are unremarkable. Cerebrovascular atherosclerotic calcifications are noted.  The osseous structures are unremarkable.  IMPRESSION: No acute intracranial pathology.   Electronically Signed   By: Kathreen Devoid   On: 10/12/2014 12:33   Mr Brain Wo Contrast  10/12/2014   CLINICAL DATA:  64 year old male with previous infarct with residual left side weakness. Brainstem infarcts in April. Increased right side versus generalized weakness at 0100 hours, could not get back to bed from restroom. Initial encounter.  EXAM: MRI HEAD WITHOUT CONTRAST  TECHNIQUE: Multiplanar, multiecho pulse sequences of the brain and surrounding structures were obtained without intravenous contrast.  COMPARISON:  Head CT without contrast 1201 hours today. Brain MRI 09/06/2014.  FINDINGS: Major intracranial vascular flow voids are stable. Residual heterogeneous diffusion signal in the right paracentral pons appears to be evolution of the changes in April. No new restricted diffusion identified. No associated mass effect.  Elsewhere Stable T2 weighted imaging appearance of the brain with confluent cerebral white matter signal abnormality and chronic left corona radiata lacunar infarct tracking to the external capsule. Stable right operculum white matter hemosiderin. No midline shift, mass effect, evidence of mass lesion, ventriculomegaly, extra-axial collection or acute intracranial hemorrhage. Cervicomedullary junction and pituitary are within normal limits. Stable visualized cervical spine. Small focus of cortical encephalomalacia in the left posterior MCA territory is stable (series 7, image 18). No new signal abnormality identified.  Visible internal auditory structures appear normal. Stable paranasal sinuses and mastoids. Stable orbit and scalp  soft tissues. Normal bone marrow signal.  IMPRESSION: 1. Expected evolution of right brainstem infarct which occurred last month. No new intracranial abnormality. 2. Severe underlying chronic primarily small vessel ischemia.   Electronically Signed   By: Genevie Ann M.D.   On: 10/12/2014 15:20    Scheduled Meds: . amLODipine  10 mg Oral q morning - 10a  . aspirin  325 mg Oral Daily  . atorvastatin  80 mg Oral q1800  . clopidogrel  75 mg Oral Daily  . famotidine  20 mg Oral QPM  . heparin  5,000 Units Subcutaneous 3 times per day  . insulin aspart  0-15 Units Subcutaneous TID WC  . insulin aspart  0-5 Units Subcutaneous QHS  . insulin detemir  10 Units Subcutaneous QHS  . metFORMIN  1,000 mg Oral BID WC  . metoprolol succinate  50 mg Oral Daily   Continuous Infusions:   Active Problems:   Hypertension   Diabetes   Chronic kidney disease   Right sided weakness Hematuria   Time spent: 25mins    MEMON,JEHANZEB  [facility] Pager [phone]. If 7PM-7AM, please contact night-coverage at [email or web address], password [facility] 10/13/2014, 3:16 PM  LOS: 1 day

## 2014-10-13 NOTE — Evaluation (Addendum)
Physical Therapy Evaluation Patient Details Name: Jim [name] MRN: [id number] DOB: [birth year] Today's Date: 10/13/2014   History of Present Illness  This is a 64 year old man who is in this hospital approximately one month ago with this acute CVA mainly right hemispheric, now presents with right leg weakness which started at 1 AM this morning. He denies any arm weakness although he is not quite clear on this history. There is no new speech difficulties. There is no loss of consciousness. He is compliant with taking Plavix and aspirin from discharge last month. Evaluation in the emergency room with MRI brain scan does not show an acute new CVA. He is now being admitted for further management.  Clinical Impression  Pt was seen for evaluation.  He was alert and oriented, very cooperative.  He states that the strength in his right leg has improved but he is concerned about the weakness of his LUE.  On muscle testing, he is found to have 4/5 strength of the RLE with good coordination/sensation.  His LLE strength is significantly decreased from that seen at his last admission following his stroke (1/5 at ankle, 3+/5 at knee, 3-/5 hip flexors and 2/5 hip abduction).  His LUE is also significantly decreased from that recorded by OT at last admission.  He had been receiving OP PT after last admission and able to do fairly advanced strengthening exercise and was ambulating with a cane.  Now, he is needing a walker for gait and is a very high fall risk.  His standing balance with no assistive device is poor.  Pt has had no new visual deficits.  I am recommending SNF at d/c as he is a high fall risk and his wife works during the day.    Follow Up Recommendations SNF    Equipment Recommendations  None recommended by PT    Recommendations for Other Services   OT    Precautions / Restrictions Precautions Precautions: Fall Restrictions Weight Bearing Restrictions: No      Mobility  Bed  Mobility Overal bed mobility: Needs Assistance       Supine to sit: HOB elevated;Mod assist     General bed mobility comments: pt was unable to move left hip anteriorly in order to scoot to edge of bed  Transfers Overall transfer level: Needs assistance Equipment used: Straight cane Transfers: Sit to/from Stand Sit to Stand: Min guard         General transfer comment: pt is unable to stand without one hand assist   Ambulation/Gait Ambulation/Gait assistance: Min assist Ambulation Distance (Feet): 90 Feet Assistive device: Rolling walker (2 wheeled) Gait Pattern/deviations: Step-through pattern;Decreased weight shift to left;Decreased stance time - left;Decreased dorsiflexion - left Gait velocity: pt was advised to walk slowly in order to focus on each step taken especially with the LLE   General Gait Details: attempted gait with a cane but this was very unstable due to LLE weakness.Marland Kitchen.he was given a walker for support, his left hand needed to be placed on the hand grip as he was unable to control finger flex/ext....gait with a walker was improved                       Balance Overall balance assessment: Needs assistance Sitting-balance support: No upper extremity supported;Feet supported Sitting balance-Leahy Scale: Good     Standing balance support: No upper extremity supported Standing balance-Leahy Scale: Poor  Pertinent Vitals/Pain Pain Assessment: No/denies pain    Home Living Family/patient expects to be discharged to:: Skilled nursing facility Living Arrangements: Spouse/significant other                    Prior Function Level of Independence: Independent with assistive device(s)         Comments: Pt had been ambulating with a cane, attending OP PT for LE strengthening     Hand Dominance   Dominant Hand: Right    Extremity/Trunk Assessment   Upper Extremity Assessment: Defer to OT  evaluation (noted a significant decline in strength of the LUE now as compared to strength at last admission)           Lower Extremity Assessment: RLE deficits/detail;LLE deficits/detail RLE Deficits / Details: strength is graded at 4/5 strength but pt states that is feels just a little weaker than normal...it is much better than that present yesterday LLE Deficits / Details: strength is 1/5 in ankle, 3+/5 in the knee:  hip flexor strength =3-/5, abduction= 2/5...increased flexor tone noted  Cervical / Trunk Assessment: Normal  Communication   Communication: No difficulties (speech is slightly slurred)  Cognition Arousal/Alertness: Awake/alert Behavior During Therapy: WFL for tasks assessed/performed Overall Cognitive Status: Within Functional Limits for tasks assessed                                    Assessment/Plan    PT Assessment Patient needs continued PT services  PT Diagnosis Difficulty walking;Abnormality of gait;Hemiplegia non-dominant side   PT Problem List Decreased strength;Decreased activity tolerance;Decreased balance;Decreased mobility;Decreased coordination;Impaired tone  PT Treatment Interventions Gait training;Functional mobility training;Therapeutic exercise;Neuromuscular re-education;Balance training   PT Goals (Current goals can be found in the Care Plan section) Acute Rehab PT Goals Patient Stated Goal: wants to be able to ambulate independently and increase the strength of the LUE PT Goal Formulation: With patient/family Time For Goal Achievement: 10/27/14 Potential to Achieve Goals: Good    Frequency Min 3X/week   Barriers to discharge Decreased caregiver support wife works daytime, pt is alone                   End of Session Equipment Utilized During Treatment: Gait belt Activity Tolerance: Patient tolerated treatment well Patient left: in chair;with call bell/phone within reach;with nursing/sitter in room;with  family/visitor present Nurse Communication: Mobility status         Time: 0828-0904 PT Time Calculation (min) (ACUTE ONLY): 36 min   Charges:   PT Evaluation $Initial PT Evaluation Tier I: 1 Procedure           Demetrios Isaacs L  PT 10/13/2014, 9:28 AM

## 2014-10-13 NOTE — Care Management Note (Signed)
Case Management Note  Patient Details  Name: Jim [name] MRN: [id number] Date of Birth: 03/15/1951   Expected Discharge Date:                  Expected Discharge Plan:  Home/Self Care  In-House Referral:  Clinical Social Work  Discharge planning Services  CM Consult  Post Acute Care Choice:  NA Choice offered to:  NA  DME Arranged:    DME Agency:     HH Arranged:    [place] Agency:     Status of Service:  Completed, signed off  Medicare Important Message Given:    Date Medicare IM Given:    Medicare IM give by:    Date Additional Medicare IM Given:    Additional Medicare Important Message give by:     If discussed at [place] of Stay Meetings, dates discussed:    Additional Comments: Pt is from home, lives with wife. Pt recently ha CVA and has become increasingly week. PT has recommended SNF at DC and pt is agreeable. CSW has seen pt and is arranging for placement. Pt changed to OBS per attending and second level recommendations. Code 44 given to patient with his wife at the bedside. Pt has no CM needs.   Sherald Barge, RN 10/13/2014, 11:25 AM

## 2014-10-14 DIAGNOSIS — E119 Type 2 diabetes mellitus without complications: Secondary | ICD-10-CM | POA: Diagnosis not present

## 2014-10-14 DIAGNOSIS — N3001 Acute cystitis with hematuria: Secondary | ICD-10-CM

## 2014-10-14 DIAGNOSIS — M6289 Other specified disorders of muscle: Secondary | ICD-10-CM | POA: Diagnosis not present

## 2014-10-14 DIAGNOSIS — I10 Essential (primary) hypertension: Secondary | ICD-10-CM | POA: Diagnosis not present

## 2014-10-14 DIAGNOSIS — N390 Urinary tract infection, site not specified: Secondary | ICD-10-CM | POA: Diagnosis present

## 2014-10-14 LAB — GLUCOSE, CAPILLARY
GLUCOSE-CAPILLARY: 104 mg/dL — AB (ref 65–99)
Glucose-Capillary: 169 mg/dL — ABNORMAL HIGH (ref 65–99)

## 2014-10-14 MED ORDER — METFORMIN HCL ER 500 MG PO TB24: 500.0000 mg | ORAL_TABLET | Freq: Two times a day (BID) | ORAL | Status: AC

## 2014-10-14 MED ORDER — CIPROFLOXACIN HCL 500 MG PO TABS: 500.0000 mg | ORAL_TABLET | Freq: Two times a day (BID) | ORAL | Status: DC

## 2014-10-14 MED ORDER — DEXTROSE 5 % IV SOLN
1.0000 g | INTRAVENOUS | Status: DC
  Administered 2014-10-14: 1 g via INTRAVENOUS
  Filled 2014-10-14 (×3): qty 10

## 2014-10-14 MED ORDER — CEFTRIAXONE SODIUM IN DEXTROSE 20 MG/ML IV SOLN
1.0000 g | INTRAVENOUS | Status: DC
  Filled 2014-10-14 (×3): qty 50

## 2014-10-14 NOTE — Plan of Care (Signed)
Problem: Phase I Progression Outcomes Goal: OOB as tolerated unless otherwise ordered Outcome: Not Progressing Pt had a fall today Goal: Voiding-avoid urinary catheter unless indicated Outcome: Progressing Urine has significant amount of blood

## 2014-10-14 NOTE — Consult Note (Signed)
[place] A. Merlene Laughter, MD     [email or web address]          Jim [name] is an 64 y.o. male.   ASSESSMENT/PLAN: 1. Subacute onset of worsening gait impairment and transient right leg weakness. Etiology is unclear but suspect that the patient may be having a reaction to pneumococcal vaccination. The patient also seen to have a UTI which can cause worsening of baseline stroke symptoms and acute gait impairment. 2. Recent subacute pontine infarct. 3. Severe chronic ischemic white matter changes which increases risk of gait impairment and cognitive impairment. 4. Hypertension. 5. Diabetes. 6. UTI and hematuria. Treatment deferred to hospitalist.  RECOMMENDATION: Physical and occupational therapy. Continue with dual antiplatelet agents for 3-6 months. Continue with risk factor modifications including control of diabetes, hypertension and statin medications.  The patient is 64 year old black male who is recently discharged from the hospital about a month ago for acute left-sided weakness due to a pontine infarct. Patient reports having improvement in his symptoms since discharge. He has been undergoing physical and occupational therapies. The patient tells me that he had a vaccination for tetanus and pneumococcal infections on Tuesday of this week. Patient reports that subsequent to that he just did not feel right. He was quite fatigued and had some balance problems. It appears that his balance and gait problem became acutely worse while he was walking Thursday with a right leg going weak. The right leg going weak upper lasted for about 5 minutes. He also reports worsening left-sided weakness. The patient does not report other symptoms such as chest pain, dyspnea, headaches, dysarthria, dysphagia or GI symptoms. The patient is noted to have hematuria in his urinal. Tells me this has been present ever since his been hospitalized. The review systems otherwise  negative.  GENERAL: This a pleasant man in no acute distress.  HEENT: Normal.  ABDOMEN: soft  EXTREMITIES: No edema   BACK: Normal.  SKIN: Normal by inspection.    MENTAL STATUS: Alert and oriented. Speech, language and cognition are generally intact. Judgment and insight normal.   CRANIAL NERVES: Pupils are equal, round and reactive to light and accomodation; extra ocular movements are full, there is no significant nystagmus; visual fields are full; upper and lower facial muscles are normal in strength and symmetric, there is no flattening of the nasolabial folds; tongue is midline; uvula is midline; shoulder elevation is normal.  MOTOR: The right side shows normal tone, bulk and strength. Left hip flexion 4 minus and dorsiflexion 0/5. Left deltoid and triceps 4 minus/5. Biceps 4. The patient has a significantly increased tone of the left upper extremity and left lower extremity.  COORDINATION: Left finger to nose is normal, right finger to nose is normal, No rest tremor; no intention tremor; no postural tremor; no bradykinesia.  REFLEXES: Deep tendon reflexes are diminished on the right but brisk on the left especially left lower extremity.   SENSATION: Normal to light touch, temperature, and pinprick.  GAIT: Unsteady with the patient struck him ducting the left lower extremity.       The brain MRI is reviewed in person. Again, there is increased signal seen on diffusion imaging right tegmental area which was present from his previous scan 1 month ago. This appears to be consistent with subacute infarct but no acute infarcts are seen. 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 T1 imaging shows bilateral medullary and pontine lucencies consistent with lacunar infarcts. The largest is  seen on the right side involving the right pontine area. There is also lucency seen in the left basal ganglia extending  to the corona radiata. Decreased lucency is seen on echo gradient scans involving the cortical and subcortical region right operculum consistent with hemosiderin deposits. Nothing acute is seen.    [[[[[[[[[[[[[[[[[[[[[[[[[[[[[[[[[[[[[[[[MY PRIOR NOTE 08-2014 1. Right pontine infarcts. Risk factors poorly controlled diabetes, hypertension and age. 2. 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 3. Remote left basal ganglia infarct. 4. 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  RECOMMENDATION: 1. Dual antiplatelet agent for 3-6 months. Subsequently, the patient should be placed on single aspirin therapy 325 mg. 2. Optimization of statin therapy. 3. Control of diabetes and hypertension. 4. Physical and occupational therapy. 5. Additional blood tests for homocysteine level, TSH, RPR and vitamin B12. 6. Follow-up in the office with [place] in 8 weeks]]]]]]]]]]]]]]]]]]]]]]]]]]]]]]]]]]]]]]]]]]]]]]]]]]]]]     Blood pressure 159/82, pulse 72, temperature 98.2 F (36.8 C), temperature source Oral, resp. rate 20, height _0  (1.727 m), weight 87.635 kg (193 lb 3.2 oz), SpO2 100 %.  Past Medical History  Diagnosis Date  . Hypertension   . Diabetes mellitus   . Chronic kidney disease     cyst on left kidney   . GERD (gastroesophageal reflux disease)   . Degenerative disc disease, lumbar   . Prostate cancer 06/11/12    gleason 7  . Hx of radiation therapy 12/13/12- 01/27/13    prostate bed 6600 cGy 33 sessions  . Stroke     Past Surgical History  Procedure Laterality Date  . Robot assisted laparoscopic radical prostatectomy  06/11/2012    Procedure: ROBOTIC ASSISTED LAPAROSCOPIC RADICAL PROSTATECTOMY;  Surgeon: Alexis Frock, MD;  Location: [facility];  Service: Urology;  Laterality: N/A;  1st procedure: Robotic Left Renal Cyst Decortication   . Lymphadenectomy  06/11/2012    Procedure: LYMPHADENECTOMY;  Surgeon:  Alexis Frock, MD;  Location: [facility];  Service: Urology;  Laterality: Bilateral;  . Robotic assited partial nephrectomy  06/11/2012    Procedure: ROBOTIC ASSITED PARTIAL NEPHRECTOMY;  Surgeon: Alexis Frock, MD;  Location: [facility];  Service: Urology;  Laterality: Left;  . Prostate biopsy  03/25/2012    Family History  Problem Relation Age of Onset  . Cancer Mother     pancreatic  . Cancer Father     lung  . Cancer Brother     prostate-surgery 6 years ago    Social History:  reports that he has never smoked. He does not have any smokeless tobacco history on file. He reports that he does not drink alcohol or use illicit drugs.  Allergies: No Known Allergies  Medications: Prior to Admission medications   Medication Sig Start Date End Date Taking? Authorizing Provider  amLODipine (NORVASC) 10 MG tablet Take 1 tablet (10 mg total) by mouth every morning. 09/08/14  Yes Orvan Falconer, MD  aspirin 325 MG tablet Take 325 mg by mouth daily.   Yes Historical Provider, MD  atorvastatin (LIPITOR) 80 MG tablet Take 1 tablet (80 mg total) by mouth daily at 6 PM. 09/08/14  Yes Orvan Falconer, MD  clopidogrel (PLAVIX) 75 MG tablet Take 1 tablet (75 mg total) by mouth daily. 09/07/14  Yes Lezlie Octave Black, NP  glipiZIDE (GLUCOTROL XL) 10 MG 24 hr tablet Take 1 tablet (10 mg total) by mouth every morning. 09/07/14  Yes Lezlie Octave Black, NP  insulin detemir (LEVEMIR) 100 UNIT/ML injection Inject 0.13 mLs (13 Units  total) into the skin daily. 09/07/14  Yes Lezlie Octave Black, NP  metFORMIN (GLUCOPHAGE-XR) 500 MG 24 hr tablet Take 2 tablets (1,000 mg total) by mouth 2 (two) times daily. Patient taking differently: Take 500 mg by mouth 2 (two) times daily.  09/08/14  Yes Orvan Falconer, MD  metoprolol succinate (TOPROL-XL) 50 MG 24 hr tablet Take 1 tablet (50 mg total) by mouth daily. 09/08/14  Yes Orvan Falconer, MD  ranitidine (ZANTAC) 150 MG capsule Take 1 capsule by mouth 2 (two) times daily as needed for heartburn.  09/14/14  Yes Historical  Provider, MD    Scheduled Meds: . amLODipine  10 mg Oral q morning - 10a  . aspirin  325 mg Oral Daily  . atorvastatin  80 mg Oral q1800  . cefTRIAXone (ROCEPHIN)  IV  1 g Intravenous Q24H  . clopidogrel  75 mg Oral Daily  . famotidine  20 mg Oral QPM  . heparin  5,000 Units Subcutaneous 3 times per day  . insulin aspart  0-15 Units Subcutaneous TID WC  . insulin aspart  0-5 Units Subcutaneous QHS  . insulin detemir  10 Units Subcutaneous QHS  . metoprolol succinate  50 mg Oral Daily   Continuous Infusions:  PRN Meds:.dextrose, ondansetron **OR** ondansetron (ZOFRAN) IV     Results for orders placed or performed during the hospital encounter of 10/12/14 (from the past 48 hour(s))  CBC with Differential     Status: Abnormal   Collection Time: 10/12/14 10:51 AM  Result Value Ref Range   WBC 5.2 4.0 - 10.5 K/uL   RBC 4.32 4.22 - 5.81 MIL/uL   Hemoglobin 10.9 (L) 13.0 - 17.0 g/dL   HCT 33.7 (L) 39.0 - 52.0 %   MCV 78.0 78.0 - 100.0 fL   MCH 25.2 (L) 26.0 - 34.0 pg   MCHC 32.3 30.0 - 36.0 g/dL   RDW 13.4 11.5 - 15.5 %   Platelets 228 150 - 400 K/uL   Neutrophils Relative % 71 43 - 77 %   Neutro Abs 3.7 1.7 - 7.7 K/uL   Lymphocytes Relative 17 12 - 46 %   Lymphs Abs 0.9 0.7 - 4.0 K/uL   Monocytes Relative 7 3 - 12 %   Monocytes Absolute 0.3 0.1 - 1.0 K/uL   Eosinophils Relative 5 0 - 5 %   Eosinophils Absolute 0.3 0.0 - 0.7 K/uL   Basophils Relative 0 0 - 1 %   Basophils Absolute 0.0 0.0 - 0.1 K/uL  Basic metabolic panel     Status: Abnormal   Collection Time: 10/12/14 10:51 AM  Result Value Ref Range   Sodium 141 135 - 145 mmol/L   Potassium 3.7 3.5 - 5.1 mmol/L   Chloride 106 101 - 111 mmol/L   CO2 28 22 - 32 mmol/L   Glucose, Bld 79 65 - 99 mg/dL   BUN 24 (H) 6 - 20 mg/dL   Creatinine, Ser 1.46 (H) 0.61 - 1.24 mg/dL   Calcium 9.2 8.9 - 10.3 mg/dL   GFR calc non Af Amer 49 (L) >60 mL/min   GFR calc Af Amer 56 (L) >60 mL/min    Comment: (NOTE) The eGFR has been  calculated using the CKD EPI equation. This calculation has not been validated in all clinical situations. eGFR's persistently <60 mL/min signify possible Chronic Kidney Disease.    Anion gap 7 5 - 15  Glucose, capillary     Status: Abnormal   Collection Time: 10/12/14  4:06  PM  Result Value Ref Range   Glucose-Capillary 59 (L) 65 - 99 mg/dL  Glucose, capillary     Status: None   Collection Time: 10/12/14  4:32 PM  Result Value Ref Range   Glucose-Capillary 69 65 - 99 mg/dL  Glucose, capillary     Status: Abnormal   Collection Time: 10/12/14  5:23 PM  Result Value Ref Range   Glucose-Capillary 56 (L) 65 - 99 mg/dL  Glucose, capillary     Status: None   Collection Time: 10/12/14  7:01 PM  Result Value Ref Range   Glucose-Capillary 83 65 - 99 mg/dL  Glucose, capillary     Status: None   Collection Time: 10/12/14  8:53 PM  Result Value Ref Range   Glucose-Capillary 67 65 - 99 mg/dL  Glucose, capillary     Status: Abnormal   Collection Time: 10/12/14  9:42 PM  Result Value Ref Range   Glucose-Capillary 178 (H) 65 - 99 mg/dL  Comprehensive metabolic panel     Status: Abnormal   Collection Time: 10/13/14  5:57 AM  Result Value Ref Range   Sodium 141 135 - 145 mmol/L   Potassium 3.8 3.5 - 5.1 mmol/L   Chloride 107 101 - 111 mmol/L   CO2 27 22 - 32 mmol/L   Glucose, Bld 70 65 - 99 mg/dL   BUN 21 (H) 6 - 20 mg/dL   Creatinine, Ser 1.45 (H) 0.61 - 1.24 mg/dL   Calcium 9.1 8.9 - 10.3 mg/dL   Total Protein 6.6 6.5 - 8.1 g/dL   Albumin 3.7 3.5 - 5.0 g/dL   AST 19 15 - 41 U/L   ALT 16 (L) 17 - 63 U/L   Alkaline Phosphatase 47 38 - 126 U/L   Total Bilirubin 0.5 0.3 - 1.2 mg/dL   GFR calc non Af Amer 49 (L) >60 mL/min   GFR calc Af Amer 57 (L) >60 mL/min    Comment: (NOTE) The eGFR has been calculated using the CKD EPI equation. This calculation has not been validated in all clinical situations. eGFR's persistently <60 mL/min signify possible Chronic Kidney Disease.    Anion  gap 7 5 - 15  CBC     Status: Abnormal   Collection Time: 10/13/14  5:57 AM  Result Value Ref Range   WBC 4.8 4.0 - 10.5 K/uL   RBC 4.39 4.22 - 5.81 MIL/uL   Hemoglobin 10.8 (L) 13.0 - 17.0 g/dL   HCT 34.0 (L) 39.0 - 52.0 %   MCV 77.4 (L) 78.0 - 100.0 fL   MCH 24.6 (L) 26.0 - 34.0 pg   MCHC 31.8 30.0 - 36.0 g/dL   RDW 13.4 11.5 - 15.5 %   Platelets 237 150 - 400 K/uL  Glucose, capillary     Status: None   Collection Time: 10/13/14  7:44 AM  Result Value Ref Range   Glucose-Capillary 75 65 - 99 mg/dL   Comment 1 Notify RN    Comment 2 Document in Chart   Glucose, capillary     Status: Abnormal   Collection Time: 10/13/14  9:31 AM  Result Value Ref Range   Glucose-Capillary 138 (H) 65 - 99 mg/dL  Glucose, capillary     Status: Abnormal   Collection Time: 10/13/14 11:22 AM  Result Value Ref Range   Glucose-Capillary 105 (H) 65 - 99 mg/dL   Comment 1 Notify RN    Comment 2 Document in Chart   Glucose, capillary  Status: None   Collection Time: 10/13/14  5:01 PM  Result Value Ref Range   Glucose-Capillary 66 65 - 99 mg/dL   Comment 1 Notify RN    Comment 2 Document in Chart    Comment 3 Repeat Test   Glucose, capillary     Status: Abnormal   Collection Time: 10/13/14  5:54 PM  Result Value Ref Range   Glucose-Capillary 109 (H) 65 - 99 mg/dL   Comment 1 Notify RN    Comment 2 Document in Chart   Glucose, capillary     Status: None   Collection Time: 10/13/14  9:06 PM  Result Value Ref Range   Glucose-Capillary 87 65 - 99 mg/dL   Comment 1 Notify RN    Comment 2 Document in Chart   Urinalysis, Routine w reflex microscopic     Status: Abnormal   Collection Time: 10/13/14  9:20 PM  Result Value Ref Range   Color, Urine RED (A) YELLOW    Comment: BIOCHEMICALS MAY BE AFFECTED BY COLOR   APPearance CLOUDY (A) CLEAR   Specific Gravity, Urine 1.025 1.005 - 1.030   pH 6.5 5.0 - 8.0   Glucose, UA 100 (A) NEGATIVE mg/dL   Hgb urine dipstick LARGE (A) NEGATIVE   Bilirubin  Urine MODERATE (A) NEGATIVE   Ketones, ur 15 (A) NEGATIVE mg/dL   Protein, ur >300 (A) NEGATIVE mg/dL   Urobilinogen, UA 4.0 (H) 0.0 - 1.0 mg/dL   Nitrite POSITIVE (A) NEGATIVE   Leukocytes, UA MODERATE (A) NEGATIVE  Urine microscopic-add on     Status: Abnormal   Collection Time: 10/13/14  9:20 PM  Result Value Ref Range   Squamous Epithelial / LPF RARE RARE   WBC, UA TOO NUMEROUS TO COUNT <3 WBC/hpf   RBC / HPF TOO NUMEROUS TO COUNT <3 RBC/hpf   Bacteria, UA MANY (A) RARE  Glucose, capillary     Status: Abnormal   Collection Time: 10/14/14  7:40 AM  Result Value Ref Range   Glucose-Capillary 104 (H) 65 - 99 mg/dL   Comment 1 Notify RN    Comment 2 Document in Chart     Studies/Results:   Elsewhere Stable T2 weighted imaging appearance of the brain with confluent cerebral white matter signal abnormality and chronic left corona radiata lacunar infarct tracking to the external capsule. Stable right operculum white matter hemosiderin. No midline shift, mass effect, evidence of mass lesion, ventriculomegaly, extra-axial collection or acute intracranial hemorrhage. Cervicomedullary junction and pituitary are within normal limits. Stable visualized cervical spine. Small focus of cortical encephalomalacia in the left posterior MCA territory is stable (series 7, image 18). No new signal abnormality identified.  Visible internal auditory structures appear normal. Stable paranasal sinuses and mastoids. Stable orbit and scalp soft tissues. Normal bone marrow signal.  IMPRESSION: 1. Expected evolution of right brainstem infarct which occurred last month. No new intracranial abnormality. 2. Severe underlying chronic primarily small vessel ischemia.   Kennedie Pardoe A. Merlene Laughter, M.D.  Diplomate, [employer] of Psychiatry and Neurology ( Neurology). 10/14/2014, 7:47 AM

## 2014-10-14 NOTE — Discharge Summary (Signed)
Physician Discharge Summary  Jim [name] MPN:[id number] DOB: [birth year] DOA: 10/12/2014  PCP: Petra Kuba, MD  Admit date: 10/12/2014 Discharge date: 10/14/2014  Time spent: 40 minutes  Recommendations for Outpatient Follow-up:  1. Patient will be discharged to [facility] center for physical therapy  Discharge Diagnoses:  Active Problems:   Hypertension   Diabetes   Chronic kidney disease   Right sided weakness   UTI (urinary tract infection)   Discharge Condition: improved  Diet recommendation: low salt, low carb  Filed Weights   10/12/14 1043 10/12/14 1530  Weight: 86.183 kg (190 lb) 87.635 kg (193 lb 3.2 oz)    History of present illness:  This patient presented to the hospital with transient right leg weakness that began at 1 AM on the morning of admission. He did not have any speech difficulties, loss of consciousness. He reported compliance with taking aspirin and Plavix. He was admitted for further workup.  Hospital Course:  Regarding his right leg weakness, this appeared to improve without any significant intervention. He has chronic left-sided weakness from previous stroke which appears to be at baseline. MRI of the brain did not show any acute infarct. He was seen by neurology who recommended to continue the patient on aspirin and Plavix. He felt that his symptoms may be related to an underlying infection, did not recommend any further workup.  The patient did develop significant hematuria since his hospital stay. He has a history of prostate cancer and has had a prostatectomy in the past. He underwent CT scan of the abdomen and pelvis that did not show any acute findings other than cystitis. Urinalysis indicated possible urinary tract infection. Patient was given a dose of Rocephin and will be continued on a course of ciprofloxacin. His hematuria should clear once his infection starts to improve. He is not febrile, doesn't have any leukocytosis and does not appear  toxic.  He was seen by physical therapy and recommended skilled nursing facility placement. He can be discharged once a bed is available.  Procedures:    Consultations:  Neurology  Discharge Exam: Filed Vitals:   10/14/14 0533  BP: 159/82  Pulse: 72  Temp: 98.2 F (36.8 C)  Resp: 20    General: NAD Cardiovascular: S1, S2 RRR Respiratory: CTA B  Discharge Instructions   Discharge Instructions    Diet - low sodium heart healthy    Complete by:  As directed      Diet Carb Modified    Complete by:  As directed      Increase activity slowly    Complete by:  As directed           Current Discharge Medication List    START taking these medications   Details  ciprofloxacin (CIPRO) 500 MG tablet Take 1 tablet (500 mg total) by mouth 2 (two) times daily. Qty: 14 tablet, Refills: 0      CONTINUE these medications which have CHANGED   Details  metFORMIN (GLUCOPHAGE-XR) 500 MG 24 hr tablet Take 1 tablet (500 mg total) by mouth 2 (two) times daily. Qty: 30 tablet, Refills: 1      CONTINUE these medications which have NOT CHANGED   Details  amLODipine (NORVASC) 10 MG tablet Take 1 tablet (10 mg total) by mouth every morning. Qty: 30 tablet, Refills: 1    aspirin 325 MG tablet Take 325 mg by mouth daily.    atorvastatin (LIPITOR) 80 MG tablet Take 1 tablet (80 mg total) by mouth daily at 6  PM. Otho Darner: 30 tablet, Refills: 1    clopidogrel (PLAVIX) 75 MG tablet Take 1 tablet (75 mg total) by mouth daily. Qty: 30 tablet, Refills: 1    insulin detemir (LEVEMIR) 100 UNIT/ML injection Inject 0.13 mLs (13 Units total) into the skin daily. Qty: 10 mL, Refills: 11    metoprolol succinate (TOPROL-XL) 50 MG 24 hr tablet Take 1 tablet (50 mg total) by mouth daily. Qty: 30 tablet, Refills: 1    ranitidine (ZANTAC) 150 MG capsule Take 1 capsule by mouth 2 (two) times daily as needed for heartburn.  Refills: 0      STOP taking these medications     glipiZIDE (GLUCOTROL XL)  10 MG 24 hr tablet        No Known Allergies    The results of significant diagnostics from this hospitalization (including imaging, microbiology, ancillary and laboratory) are listed below for reference.    Significant Diagnostic Studies: Ct Abdomen Pelvis Wo Contrast  10/13/2014   CLINICAL DATA:  Gross hematuria.  EXAM: CT ABDOMEN AND PELVIS WITHOUT CONTRAST  TECHNIQUE: Multidetector CT imaging of the abdomen and pelvis was performed following the standard protocol without IV contrast.  COMPARISON:  None.  FINDINGS: Mild multilevel degenerative disc disease is noted in the lumbar spine. 3 mm nodule is noted in lingular segment of left upper lobe.  No gallstones are noted. No focal abnormalities noted in the liver, spleen or pancreas. Adrenal glands appear normal. Bilateral renal cysts are noted. No hydronephrosis or renal obstruction is noted. No renal or ureteral calculi are noted. Atherosclerosis of abdominal aorta is noted without aneurysm formation. The appendix appears normal. There is no evidence of bowel obstruction. Urinary bladder is decompressed. However, its margins are indistinct suggesting possible inflammation or cystitis. No abnormal fluid collection is noted. Status post prostatectomy. No significant adenopathy is noted.  IMPRESSION: Bilateral simple renal cysts are noted.  No hydronephrosis or renal obstruction is noted. No renal or ureteral calculi are noted.  Urinary bladder is decompressed, although appears to be inflamed concerning for cystitis. Clinical correlation is recommended.  3 mm nodule seen in lingular segment of left upper lobe. If the patient is at high risk for bronchogenic carcinoma, follow-up chest CT at 1 year is recommended. If the patient is at low risk, no follow-up is needed. This recommendation follows the consensus statement: Guidelines for Management of Small Pulmonary Nodules Detected on CT Scans: A Statement from the [employer] as published in  Radiology 2005; 237:395-400.   Electronically Signed   By: Marijo Conception, M.D.   On: 10/13/2014 16:02   Ct Head Wo Contrast  10/12/2014   CLINICAL DATA:  Left-sided deficit from prior stroke. Slurred speech. Right-sided weakness.  EXAM: CT HEAD WITHOUT CONTRAST  TECHNIQUE: Contiguous axial images were obtained from the base of the skull through the vertex without intravenous contrast.  COMPARISON:  MR brain 09/06/2014  FINDINGS: There is no evidence of mass effect, midline shift, or extra-axial fluid collections. There is no evidence of a space-occupying lesion or intracranial hemorrhage. There is no evidence of a cortical-based area of acute infarction. There is an old left external capsule lacunar infarct. There an old right pontine lacunar infarct. There is periventricular white matter low attenuation likely secondary to microangiopathy.  The ventricles and sulci are appropriate for the patient's age. The basal cisterns are patent.  Visualized portions of the orbits are unremarkable. The visualized portions of the paranasal sinuses and mastoid air cells are unremarkable. Cerebrovascular  atherosclerotic calcifications are noted.  The osseous structures are unremarkable.  IMPRESSION: No acute intracranial pathology.   Electronically Signed   By: Kathreen Devoid   On: 10/12/2014 12:33   Mr Brain Wo Contrast  10/12/2014   CLINICAL DATA:  64 year old male with previous infarct with residual left side weakness. Brainstem infarcts in April. Increased right side versus generalized weakness at 0100 hours, could not get back to bed from restroom. Initial encounter.  EXAM: MRI HEAD WITHOUT CONTRAST  TECHNIQUE: Multiplanar, multiecho pulse sequences of the brain and surrounding structures were obtained without intravenous contrast.  COMPARISON:  Head CT without contrast 1201 hours today. Brain MRI 09/06/2014.  FINDINGS: Major intracranial vascular flow voids are stable. Residual heterogeneous diffusion signal in the  right paracentral pons appears to be evolution of the changes in April. No new restricted diffusion identified. No associated mass effect.  Elsewhere Stable T2 weighted imaging appearance of the brain with confluent cerebral white matter signal abnormality and chronic left corona radiata lacunar infarct tracking to the external capsule. Stable right operculum white matter hemosiderin. No midline shift, mass effect, evidence of mass lesion, ventriculomegaly, extra-axial collection or acute intracranial hemorrhage. Cervicomedullary junction and pituitary are within normal limits. Stable visualized cervical spine. Small focus of cortical encephalomalacia in the left posterior MCA territory is stable (series 7, image 18). No new signal abnormality identified.  Visible internal auditory structures appear normal. Stable paranasal sinuses and mastoids. Stable orbit and scalp soft tissues. Normal bone marrow signal.  IMPRESSION: 1. Expected evolution of right brainstem infarct which occurred last month. No new intracranial abnormality. 2. Severe underlying chronic primarily small vessel ischemia.   Electronically Signed   By: Genevie Ann M.D.   On: 10/12/2014 15:20    Microbiology: No results found for this or any previous visit (from the past 240 hour(s)).   Labs: Basic Metabolic Panel:  Recent Labs Lab 10/12/14 1051 10/13/14 0557  NA 141 141  K 3.7 3.8  CL 106 107  CO2 28 27  GLUCOSE 79 70  BUN 24* 21*  CREATININE 1.46* 1.45*  CALCIUM 9.2 9.1   Liver Function Tests:  Recent Labs Lab 10/13/14 0557  AST 19  ALT 16*  ALKPHOS 47  BILITOT 0.5  PROT 6.6  ALBUMIN 3.7   No results for input(s): LIPASE, AMYLASE in the last 168 hours. No results for input(s): AMMONIA in the last 168 hours. CBC:  Recent Labs Lab 10/12/14 1051 10/13/14 0557  WBC 5.2 4.8  NEUTROABS 3.7  --   HGB 10.9* 10.8*  HCT 33.7* 34.0*  MCV 78.0 77.4*  PLT 228 237   Cardiac Enzymes: No results for input(s): CKTOTAL,  CKMB, CKMBINDEX, TROPONINI in the last 168 hours. BNP: BNP (last 3 results) No results for input(s): BNP in the last 8760 hours.  ProBNP (last 3 results) No results for input(s): PROBNP in the last 8760 hours.  CBG:  Recent Labs Lab 10/13/14 1701 10/13/14 1754 10/13/14 2106 10/14/14 0740 10/14/14 1128  GLUCAP 66 109* 87 104* 169*       Signed:  Umer Harig  [facility] 10/14/2014, 12:25 PM

## 2014-10-14 NOTE — Progress Notes (Signed)
Report called to [facility] in [place]. Documents faxed earlier to [facility] spoke with the Administrator earlier regarding patient being admitted today to [facility]. Notified patient wife regarding transportation to [facility].

## 2014-10-14 NOTE — Progress Notes (Signed)
Patient transported to [facility] via car.

## 2014-10-16 ENCOUNTER — Encounter (HOSPITAL_COMMUNITY): Payer: Self-pay | Admitting: *Deleted

## 2014-10-16 ENCOUNTER — Emergency Department (HOSPITAL_COMMUNITY)
Admission: EM | Admit: 2014-10-16 | Discharge: 2014-10-17 | Disposition: A | Discharge status: Home / Self Care | Payer: BLUE CROSS/BLUE SHIELD | Attending: Emergency Medicine | Admitting: Emergency Medicine

## 2014-10-16 ENCOUNTER — Ambulatory Visit (HOSPITAL_COMMUNITY): Payer: Medicare Other | Admitting: Physical Therapy

## 2014-10-16 DIAGNOSIS — Z79899 Other long term (current) drug therapy: Secondary | ICD-10-CM | POA: Diagnosis not present

## 2014-10-16 DIAGNOSIS — Z7902 Long term (current) use of antithrombotics/antiplatelets: Secondary | ICD-10-CM | POA: Diagnosis not present

## 2014-10-16 DIAGNOSIS — Z792 Long term (current) use of antibiotics: Secondary | ICD-10-CM | POA: Insufficient documentation

## 2014-10-16 DIAGNOSIS — Z923 Personal history of irradiation: Secondary | ICD-10-CM | POA: Insufficient documentation

## 2014-10-16 DIAGNOSIS — M79605 Pain in left leg: Secondary | ICD-10-CM | POA: Diagnosis present

## 2014-10-16 DIAGNOSIS — I129 Hypertensive chronic kidney disease with stage 1 through stage 4 chronic kidney disease, or unspecified chronic kidney disease: Secondary | ICD-10-CM | POA: Diagnosis not present

## 2014-10-16 DIAGNOSIS — Z8673 Personal history of transient ischemic attack (TIA), and cerebral infarction without residual deficits: Secondary | ICD-10-CM | POA: Insufficient documentation

## 2014-10-16 DIAGNOSIS — K219 Gastro-esophageal reflux disease without esophagitis: Secondary | ICD-10-CM | POA: Diagnosis not present

## 2014-10-16 DIAGNOSIS — Z7982 Long term (current) use of aspirin: Secondary | ICD-10-CM | POA: Insufficient documentation

## 2014-10-16 DIAGNOSIS — E119 Type 2 diabetes mellitus without complications: Secondary | ICD-10-CM | POA: Diagnosis not present

## 2014-10-16 DIAGNOSIS — N189 Chronic kidney disease, unspecified: Secondary | ICD-10-CM | POA: Insufficient documentation

## 2014-10-16 DIAGNOSIS — R531 Weakness: Secondary | ICD-10-CM | POA: Diagnosis not present

## 2014-10-16 DIAGNOSIS — Z8546 Personal history of malignant neoplasm of prostate: Secondary | ICD-10-CM | POA: Diagnosis not present

## 2014-10-16 DIAGNOSIS — N390 Urinary tract infection, site not specified: Secondary | ICD-10-CM | POA: Insufficient documentation

## 2014-10-16 LAB — URINALYSIS, ROUTINE W REFLEX MICROSCOPIC
Bilirubin Urine: NEGATIVE
GLUCOSE, UA: NEGATIVE mg/dL
KETONES UR: NEGATIVE mg/dL
Leukocytes, UA: NEGATIVE
NITRITE: NEGATIVE
Protein, ur: >300 mg/dL — AB
Specific Gravity, Urine: 1.02 (ref 1.005–1.030)
Urobilinogen, UA: 0.2 mg/dL (ref 0.0–1.0)
pH: 5 (ref 5.0–8.0)

## 2014-10-16 LAB — CBC WITH DIFFERENTIAL/PLATELET
BASOS ABS: 0 10*3/uL (ref 0.0–0.1)
Basophils Relative: 0 % (ref 0–1)
EOS PCT: 4 % (ref 0–5)
Eosinophils Absolute: 0.2 10*3/uL (ref 0.0–0.7)
HCT: 35 % — ABNORMAL LOW (ref 39.0–52.0)
Hemoglobin: 11.2 g/dL — ABNORMAL LOW (ref 13.0–17.0)
Lymphocytes Relative: 16 % (ref 12–46)
Lymphs Abs: 1 10*3/uL (ref 0.7–4.0)
MCH: 24.7 pg — ABNORMAL LOW (ref 26.0–34.0)
MCHC: 32 g/dL (ref 30.0–36.0)
MCV: 77.3 fL — AB (ref 78.0–100.0)
Monocytes Absolute: 0.6 10*3/uL (ref 0.1–1.0)
Monocytes Relative: 9 % (ref 3–12)
NEUTROS ABS: 4.7 10*3/uL (ref 1.7–7.7)
Neutrophils Relative %: 71 % (ref 43–77)
Platelets: 254 10*3/uL (ref 150–400)
RBC: 4.53 MIL/uL (ref 4.22–5.81)
RDW: 13.5 % (ref 11.5–15.5)
WBC: 6.6 10*3/uL (ref 4.0–10.5)

## 2014-10-16 LAB — COMPREHENSIVE METABOLIC PANEL
ALT: 18 U/L (ref 17–63)
ANION GAP: 11 (ref 5–15)
AST: 20 U/L (ref 15–41)
Albumin: 4.2 g/dL (ref 3.5–5.0)
Alkaline Phosphatase: 58 U/L (ref 38–126)
BILIRUBIN TOTAL: 0.3 mg/dL (ref 0.3–1.2)
BUN: 30 mg/dL — AB (ref 6–20)
CALCIUM: 9.5 mg/dL (ref 8.9–10.3)
CHLORIDE: 101 mmol/L (ref 101–111)
CO2: 28 mmol/L (ref 22–32)
CREATININE: 1.56 mg/dL — AB (ref 0.61–1.24)
GFR calc Af Amer: 52 mL/min — ABNORMAL LOW (ref >60)
GFR, EST NON AFRICAN AMERICAN: 45 mL/min — AB (ref >60)
Glucose, Bld: 185 mg/dL — ABNORMAL HIGH (ref 65–99)
POTASSIUM: 4.4 mmol/L (ref 3.5–5.1)
Sodium: 140 mmol/L (ref 135–145)
Total Protein: 7.5 g/dL (ref 6.5–8.1)

## 2014-10-16 LAB — URINE MICROSCOPIC-ADD ON

## 2014-10-16 MED ORDER — OXYCODONE-ACETAMINOPHEN 5-325 MG PO TABS
2.0000 | ORAL_TABLET | Freq: Once | ORAL | Status: AC
  Administered 2014-10-16: 2 via ORAL
  Filled 2014-10-16: qty 2

## 2014-10-16 NOTE — ED Provider Notes (Signed)
CSN: [id number]     Arrival date & time 10/16/14  1936 History   First MD Initiated Contact with Patient 10/16/14 2048     Chief Complaint  Patient presents with  . Leg Pain     (Consider location/radiation/quality/duration/timing/severity/associated sxs/prior Treatment) Patient is a 64 y.o. male presenting with leg pain. The history is provided by the patient. No language interpreter was used.  Leg Pain Location:  Leg Time since incident:  3 days Leg location:  L leg Pain details:    Quality:  Aching   Radiates to:  Groin   Severity:  Moderate   Onset quality:  Gradual   Duration:  3 days   Timing:  Constant   Progression:  Worsening Chronicity:  New Prior injury to area:  No Relieved by:  Nothing Worsened by:  Nothing tried Ineffective treatments:  None tried Associated symptoms: decreased ROM   Risk factors: no concern for non-accidental trauma   Pt discharged from hospital on 5/20 and placed at [facility] for [facility].  Pt's family reports they picked pt up today and brought him here.   Family reports facility is not caring for pt.  Pt not given anything for pain.  Family reports facility would not give him anything to drink. Pt here for evaluation of leg pain and assistance getting into another facility  Past Medical History  Diagnosis Date  . Hypertension   . Diabetes mellitus   . Chronic kidney disease     cyst on left kidney   . GERD (gastroesophageal reflux disease)   . Degenerative disc disease, lumbar   . Prostate cancer 06/11/12    gleason 7  . Hx of radiation therapy 12/13/12- 01/27/13    prostate bed 6600 cGy 33 sessions  . Stroke    Past Surgical History  Procedure Laterality Date  . Robot assisted laparoscopic radical prostatectomy  06/11/2012    Procedure: ROBOTIC ASSISTED LAPAROSCOPIC RADICAL PROSTATECTOMY;  Surgeon: Alexis Frock, MD;  Location: [facility];  Service: Urology;  Laterality: N/A;  1st procedure: Robotic Left Renal Cyst Decortication   .  Lymphadenectomy  06/11/2012    Procedure: LYMPHADENECTOMY;  Surgeon: Alexis Frock, MD;  Location: [facility];  Service: Urology;  Laterality: Bilateral;  . Robotic assited partial nephrectomy  06/11/2012    Procedure: ROBOTIC ASSITED PARTIAL NEPHRECTOMY;  Surgeon: Alexis Frock, MD;  Location: [facility];  Service: Urology;  Laterality: Left;  . Prostate biopsy  03/25/2012   Family History  Problem Relation Age of Onset  . Cancer Mother     pancreatic  . Cancer Father     lung  . Cancer Brother     prostate-surgery 6 years ago   History  Substance Use Topics  . Smoking status: Never Smoker   . Smokeless tobacco: Not on file  . Alcohol Use: No     Comment: no use in 1 year per patient     Review of Systems  Neurological: Positive for weakness.  All other systems reviewed and are negative.     Allergies  Review of patient's allergies indicates no known allergies.  Home Medications   Prior to Admission medications   Medication Sig Start Date End Date Taking? Authorizing Provider  amLODipine (NORVASC) 10 MG tablet Take 1 tablet (10 mg total) by mouth every morning. 09/08/14  Yes Orvan Falconer, MD  aspirin 325 MG tablet Take 325 mg by mouth daily.   Yes Historical Provider, MD  atorvastatin (LIPITOR) 80 MG tablet Take 1 tablet (  80 mg total) by mouth daily at 6 PM. 09/08/14  Yes Orvan Falconer, MD  clopidogrel (PLAVIX) 75 MG tablet Take 1 tablet (75 mg total) by mouth daily. 09/07/14  Yes Lezlie Octave Black, NP  glipiZIDE (GLUCOTROL XL) 10 MG 24 hr tablet Take 10 mg by mouth daily with breakfast.   Yes Historical Provider, MD  insulin detemir (LEVEMIR) 100 UNIT/ML injection Inject 0.13 mLs (13 Units total) into the skin daily. 09/07/14  Yes Lezlie Octave Black, NP  metFORMIN (GLUCOPHAGE-XR) 500 MG 24 hr tablet Take 1 tablet (500 mg total) by mouth 2 (two) times daily. Patient taking differently: Take 1,000 mg by mouth 2 (two) times daily.  10/14/14  Yes Kathie Dike, MD  metoprolol succinate (TOPROL-XL) 50 MG  24 hr tablet Take 1 tablet (50 mg total) by mouth daily. 09/08/14  Yes Orvan Falconer, MD  ranitidine (ZANTAC) 150 MG capsule Take 1 capsule by mouth 2 (two) times daily as needed for heartburn.  09/14/14  Yes Historical Provider, MD  ciprofloxacin (CIPRO) 500 MG tablet Take 1 tablet (500 mg total) by mouth 2 (two) times daily. 10/14/14   Kathie Dike, MD   BP 192/78 mmHg  Pulse 87  Temp(Src) 98.1 F (36.7 C) (Oral)  Resp 16  SpO2 100% Physical Exam  Constitutional: He appears well-developed and well-nourished.  HENT:  Head: Normocephalic.  Mouth/Throat: Oropharynx is clear and moist.  Eyes: Pupils are equal, round, and reactive to light.  Cardiovascular: Normal rate.   Pulmonary/Chest: Effort normal.  Musculoskeletal: He exhibits tenderness.  Limited movement left arm or left leg. Pt can move knee and wiggle elbow and fingers slightly.    Neurological: He is alert.  Skin: Skin is warm.  Psychiatric: He has a normal mood and affect.  Nursing note and vitals reviewed.   ED Course urinalysis improved.  Pt reports pain relief with Percocet.    Procedures (including critical care time) Labs Review Labs Reviewed  URINALYSIS, ROUTINE W REFLEX MICROSCOPIC - Abnormal; Notable for the following:    APPearance CLOUDY (*)    Hgb urine dipstick LARGE (*)    Protein, ur >300 (*)    All other components within normal limits  CBC WITH DIFFERENTIAL/PLATELET - Abnormal; Notable for the following:    Hemoglobin 11.2 (*)    HCT 35.0 (*)    MCV 77.3 (*)    MCH 24.7 (*)    All other components within normal limits  COMPREHENSIVE METABOLIC PANEL - Abnormal; Notable for the following:    Glucose, Bld 185 (*)    BUN 30 (*)    Creatinine, Ser 1.56 (*)    GFR calc non Af Amer 45 (*)    GFR calc Af Amer 52 (*)    All other components within normal limits  URINE MICROSCOPIC-ADD ON - Abnormal; Notable for the following:    Bacteria, UA FEW (*)    Casts HYALINE CASTS (*)    All other components within  normal limits    Imaging Review No results found.   EKG Interpretation None      MDM  Case manager spoke with family.  She advised pt can not go home, needs social work to see him for placement at a different facility.    Final diagnoses:  None    Pt's care turned over to Florene Glen NP     Fransico Meadow, PA-C 10/17/14 0011  Serita Grit, MD 10/20/14 [phone]

## 2014-10-16 NOTE — ED Notes (Signed)
Pt states that he had a stroke last month and has some left sided weakness from the stroke; pt states that over the last few days he has had left leg pain; pt states that he was seen at the hospital on recently for the same thing and was released to a nursing home; pt states that he signed himself out the nursing home because he didn't feel they were doing the right thing; pt states that he has been in pain since Sunday and nobody has done anything about it

## 2014-10-17 LAB — CBG MONITORING, ED: GLUCOSE-CAPILLARY: 152 mg/dL — AB (ref 65–99)

## 2014-10-17 MED ORDER — METFORMIN HCL 500 MG PO TABS: 500.0000 mg | ORAL_TABLET | Freq: Two times a day (BID) | ORAL | Status: DC

## 2014-10-17 MED ORDER — METFORMIN HCL ER 500 MG PO TB24: 500.0000 mg | ORAL_TABLET | Freq: Two times a day (BID) | ORAL | Status: DC

## 2014-10-17 MED ORDER — METFORMIN HCL ER 500 MG PO TB24
500.0000 mg | ORAL_TABLET | Freq: Two times a day (BID) | ORAL | Status: DC
  Filled 2014-10-17 (×2): qty 1

## 2014-10-17 MED ORDER — METOPROLOL SUCCINATE ER 50 MG PO TB24
50.0000 mg | ORAL_TABLET | Freq: Every day | ORAL | Status: DC
  Administered 2014-10-17: 50 mg via ORAL
  Filled 2014-10-17: qty 1

## 2014-10-17 MED ORDER — CIPROFLOXACIN HCL 500 MG PO TABS: 500.0000 mg | ORAL_TABLET | Freq: Two times a day (BID) | ORAL | Status: DC

## 2014-10-17 MED ORDER — OXYCODONE-ACETAMINOPHEN 5-325 MG PO TABS: 1.0000 | ORAL_TABLET | Freq: Four times a day (QID) | ORAL | Status: DC | PRN

## 2014-10-17 MED ORDER — OXYCODONE-ACETAMINOPHEN 5-325 MG PO TABS
1.0000 | ORAL_TABLET | Freq: Once | ORAL | Status: AC
  Administered 2014-10-17: 1 via ORAL
  Filled 2014-10-17: qty 1

## 2014-10-17 MED ORDER — FAMOTIDINE 20 MG PO TABS
20.0000 mg | ORAL_TABLET | Freq: Every day | ORAL | Status: DC
  Administered 2014-10-17: 20 mg via ORAL
  Filled 2014-10-17: qty 1

## 2014-10-17 MED ORDER — AMLODIPINE BESYLATE 10 MG PO TABS
10.0000 mg | ORAL_TABLET | Freq: Every morning | ORAL | Status: DC
  Administered 2014-10-17: 10 mg via ORAL
  Filled 2014-10-17: qty 1

## 2014-10-17 MED ORDER — INSULIN DETEMIR 100 UNIT/ML ~~LOC~~ SOLN
13.0000 [IU] | Freq: Every day | SUBCUTANEOUS | Status: DC
  Administered 2014-10-17: 13 [IU] via SUBCUTANEOUS
  Filled 2014-10-17: qty 0.13

## 2014-10-17 MED ORDER — GLIPIZIDE ER 10 MG PO TB24
10.0000 mg | ORAL_TABLET | Freq: Every day | ORAL | Status: DC
  Administered 2014-10-17: 10 mg via ORAL
  Filled 2014-10-17 (×2): qty 1

## 2014-10-17 MED ORDER — ATORVASTATIN CALCIUM 80 MG PO TABS
80.0000 mg | ORAL_TABLET | Freq: Every day | ORAL | Status: DC
  Filled 2014-10-17: qty 1

## 2014-10-17 MED ORDER — CLOPIDOGREL BISULFATE 75 MG PO TABS
75.0000 mg | ORAL_TABLET | Freq: Every day | ORAL | Status: DC
  Administered 2014-10-17: 75 mg via ORAL
  Filled 2014-10-17: qty 1

## 2014-10-17 MED ORDER — ASPIRIN 325 MG PO TABS
325.0000 mg | ORAL_TABLET | Freq: Every day | ORAL | Status: DC
  Administered 2014-10-17: 325 mg via ORAL
  Filled 2014-10-17: qty 1

## 2014-10-17 NOTE — Progress Notes (Signed)
Brief Pharmacy note: Metformin  FDA guidelines for Metformin use and renal function have changed. Hold criteria is now based on eGFR, previously used SCr as absolute criteria for holding Metformin.  SCr 1.56 this am, but eGFR > 30 ml/min, no need to hold Metformin, order resumed.  Thank you, Minda Ditto PharmD Pager [phone] 10/17/2014, 2:21 PM

## 2014-10-17 NOTE — ED Provider Notes (Signed)
  Physical Exam  BP 186/99 mmHg  Pulse 108  Temp(Src) 98.3 F (36.8 C) (Oral)  Resp 20  SpO2 97%  Physical Exam  ED Course  Procedures  MDM Physical therapy reportedly saw patient and thought that leg was more week. I have reviewed records from neurology and recent admission for similar symptoms. Neurology believed this was not a new acute stroke and had MRI done at that time. Does not appear to need new imaging at this time.      Davonna Belling, MD 10/17/14 [phone]

## 2014-10-17 NOTE — Discharge Instructions (Signed)

## 2014-10-17 NOTE — ED Notes (Signed)
Pt given water. Pt denying food at this time.

## 2014-10-17 NOTE — Care Management Note (Addendum)
Case Management Note  Patient Details  Name: Jim [name] MRN: [id number] Date of Birth: 23-Mar-1951  Subjective/Objective:   Patient discharged to [facility] on Saturday from [facility].  Patient's family signed patient out of [facility] as they felt patient was not being cared for properly.  Per chart review, PT recommended SNF as patient is a very high fall risk and unsafe to go home.            Action/Plan:  Consult social work for SNF placement.   Expected Discharge Date:                  Expected Discharge Plan:     In-House Referral:  Clinical Social Work  Discharge planning Services  CM Consult  Post Acute Care Choice:    Choice offered to:     DME Arranged:    DME Agency:     HH Arranged:    [place] Agency:     Status of Service:  Completed, signed off  Medicare Important Message Given:    Date Medicare IM Given:    Medicare IM give by:    Date Additional Medicare IM Given:    Additional Medicare Important Message give by:     If discussed at [place] of Stay Meetings, dates discussed:    Additional Comments:  Patient reports he cannot walk due to weakness.  Patient with history of CVA affecting his left side.  Patient  complaining of left leg pain.  UA reports UTI improving.  Patient's wife works daily from 4pm to 1am in the morning.  She reports patient will be alone in the home during that time.  Patient without medical equipment in the home.  Patient's wife does not want patient to return to [facility].  She reports she is agreeable to any facility that accepts [place] and is close to their home.  Discussed patient with EDP and EDPA.  Social work consult placed.  Voice message left with [facility] regarding PT consult.  No further EDCM needs at this time.  Rhonda Vangieson, RN 10/17/2014, 1:21 AM

## 2014-10-17 NOTE — Progress Notes (Addendum)
1416 CM updated by ED SW ( coverage for facility). Cm spoke with wife about home health agency choice. Wife ok with [facility], wife to pick pt up between 3-4 pm.  Cm left a voice message for [place], [facility] to provide referral for hhrn, pt, aide and sw CM spoke with Aleene Davidson, NP/PA  1224 Cm spoke with EDP about possible additional testing ED SW updated  84 After speaking with PT & SW staff after pt evaluation, Cm spoke with EDP, [place] about conclusion of PT eval ? Progression of stroke, pt with buckling of knee per PT and other s/s not noted in previous hospitalization PT eval  Cm spoke with EDP about possible further imaging    1030 Cm received call from [facility] PT staff to provided collateral for evaluation: PT evaluation, snf placement  Cm also consulted with ED SW  37 Cm spoke with wife and sister in law when they arrived to reviewed in details medicare guidelines, home health [facility] (length of stay in home, types of [facility] staff available, coverage, primary caregiver, up to 24 hrs before services may be started), Private duty nursing (PDN-coverage, length of stay in the home types of staff available),CM reviewed availability of [place] SW to assist pcp to get pt to snf (if desired disposition) from the community level. CM provided wife with a list of [place] home health agencies and PDN,    Discussed that [facility] would call her but she is welcome to call them prn [phone] cm left [facility] of [facility] a message informing of d/c and need of home health services

## 2014-10-17 NOTE — ED Notes (Signed)
Pt sleeping soundly, arousable to verbal stim.  Given lunch tray, tolerating at this time, denies further needs/complaints.  Call light in reach.  NAD.

## 2014-10-17 NOTE — ED Notes (Signed)
Breakfast tray given, pt sts he is not hungry at this time. Encouraged pt to eat and drink.

## 2014-10-17 NOTE — Evaluation (Signed)
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 Pt  was receiving OPPT at [facility], then admit to [facility] ED on 5/18 with report of increased symptoms. MRI and CT negative for further CVA, and pt  DC to SNF  for 1 day. Family niot happy with teh care and pt complaining of Left hip pain so brought to [facility] ED 10/16/2014.   Clinical Impression  Pt tolerated well, very pleasant and working hard, but concerned about the L hip pain and flexor tone and inability to do was he was able to do a few days ago. Pt with increased L hip flexor tone with very little hip hip extension (not even able to go to neutral) and with weakness and ataxia on LLE and UE with synergistic patterns of movement. Pt to benefit from continued PT to eventually DC home as pt's goal after further extensive rehab.     Follow Up Recommendations SNF    Equipment Recommendations  None recommended by PT (next venue to address. )    Recommendations for Other Services OT consult     Precautions / Restrictions Precautions Precautions: Fall Precaution Comments: Note: Pt with L UE and L LE weaknes from CVA .  Restrictions Weight Bearing Restrictions: No      Mobility  Bed Mobility Overal bed mobility: Needs Assistance Bed Mobility: Supine to Sit;Sit to Supine     Supine to sit: Mod assist Sit to supine: Max assist   General bed mobility comments: pt with difficulty scooting and using L UE and LE to assist with bed mobility. working in synergistic and abnormal movment patterns as well.   Transfers Overall transfer level: Needs assistance Equipment used: Rolling walker (2 wheeled) (with L hand grip placment assistance (grip slint on L handle of RW) ) Transfers: Sit to/from Stand Sit to Stand: Mod  assist         General transfer comment: pt require assist to rise and lower due to weakness in L LE and ataxic movement pattern as well. Practiced static standing at RW with swaying for weight shifting and needed full asssitance for L knee weight bearing to prevent buckling . Pre gait activities in standing at [facility] for 3 minute intervals (3 times). L knee and hip needing assistnce for weight bearing due to weakness with CVA.   Ambulation/Gait Ambulation/Gait assistance: +2 physical assistance Ambulation Distance (Feet): 5 Feet (about 5-6 steps followed with bed to sit down, and assisted for weight shifting, L knee control and use of RW )     Gait velocity: see above       Stairs            Wheelchair Mobility    Modified Rankin (Stroke Patients Only) Modified Rankin (Stroke Patients Only) Pre-Morbid Rankin Score: No symptoms Modified Rankin: Moderately severe disability     Balance Overall balance assessment: Needs assistance Sitting-balance support: Single extremity supported;Feet supported Sitting balance-Leahy Scale: Good       Standing balance-Leahy Scale: Poor                               Pertinent Vitals/Pain Pain Assessment: 0-10 (Pt in sidelying reporting great pain in L hip flexor area. Noted  pt with  great flexor tendon reaction to stretch causing L hip and knee to flex up in positon whne he tries to relax in extension. Pain decreased when pt put in resting hip flexion position) Pain Score: 9  Pain Location: Le hip fleor area (see note above) in great pain when the hip flexors go into automic flexion after extension stretch on hip flexor tendons.  Pain Descriptors / Indicators: Stabbing Pain Intervention(s): Repositioned;Limited activity within patient's tolerance    Home Living Family/patient expects to be discharged to:: Skilled nursing facility Living Arrangements: Spouse/significant other                    Prior Function Level  of Independence: Independent with assistive device(s)         Comments: Pt had been ambulating with a cane with Assist, attending OP PT for LE strengthening     Hand Dominance   Dominant Hand: Right    Extremity/Trunk Assessment   Upper Extremity Assessment: LUE deficits/detail;Defer to OT evaluation (noted L hand with 2/5 grip, but no extension. And L UE grossly 2/5 for all movemtn and in syergistic patterns, no isolated patterns at all. Noted L hand swelling as well. )       LUE Deficits / Details: see ntoe above.    Lower Extremity Assessment: LLE deficits/detail   LLE Deficits / Details: strength is 1/5 in ankle planter flexion and 0/5 dorsiflexion, 2+/5 in the knee extensionand flexion:  hip flexor strength 2-/5, abduction= 2/5...increased flexor tone noted, no isolated movment patterns, all synergistic patterns.      Communication   Communication: No difficulties (slight slurred speech but states this was from teh CVA in April. )  Cognition Arousal/Alertness: Awake/alert Behavior During Therapy: WFL for tasks assessed/performed Overall Cognitive Status: Within Functional Limits for tasks assessed                      General Comments      Exercises Other Exercises Other Exercises: worked a lot in supine and sitting EOB to help release left hip flexor . When pt would roll supine and any tension on L hip flexor would occur, increase pain an L hip flexor reflex  would cause L leg to draw up into flexion with great pain. Was able to release with sitting EOB due to flexion at hip flexor and able to release knee into full extension passively. Pt able to sittin in bed only if HOB elevated to above 70-80 degrees to take  stretch off hip flexor.       Assessment/Plan    PT Assessment Patient needs continued PT services  PT Diagnosis Difficulty walking;Hemiplegia non-dominant side;Abnormality of gait   PT Problem List Decreased strength;Decreased range of  motion;Decreased mobility;Decreased balance;Decreased activity tolerance;Decreased coordination;Impaired tone  PT Treatment Interventions Gait training;DME instruction;Functional mobility training;Therapeutic activities;Therapeutic exercise;Patient/family education;Neuromuscular re-education;Balance training   PT Goals (Current goals can be found in the Care Plan section) Acute Rehab PT Goals Patient Stated Goal: wants to be able to ambulate independently and increase the strength of the LUE, very worried about what is going on at the moment, however very pleasnat and nice man. Works hard.  PT Goal Formulation: With patient Time For Goal Achievement: 10/31/14 Potential to Achieve Goals: Good    Frequency Min 4X/week   Barriers to discharge Decreased caregiver support (pt's wife works )      Conservation officer, nature  End of Session Equipment Utilized During Treatment: Gait belt Activity Tolerance: Patient tolerated treatment well Patient left: in bed;with call bell/phone within reach Nurse Communication: Mobility status (to CNA and CNA was in room for mobility assistance. )    Functional Assessment Tool Used: clinical judgement Functional Limitation: Mobility: Walking and moving around Mobility: Walking and Moving Around Current Status (E9937): At least 60 percent but less than 80 percent impaired, limited or restricted Mobility: Walking and Moving Around Goal Status [phone]): At least 1 percent but less than 20 percent impaired, limited or restricted    Time: 1100-1130 PT Time Calculation (min) (ACUTE ONLY): 30 min   Charges:   PT Evaluation $Initial PT Evaluation Tier I: 1 Procedure PT Treatments $Neuromuscular Re-education: 8-22 mins   PT G Codes:   PT G-Codes **NOT FOR INPATIENT CLASS** Functional Assessment Tool Used: clinical judgement Functional Limitation: Mobility: Walking and moving around Mobility: Walking and Moving Around Current Status (E9381): At least 60  percent but less than 80 percent impaired, limited or restricted Mobility: Walking and Moving Around Goal Status [phone]): At least 1 percent but less than 20 percent impaired, limited or restricted    Clide Dales 10/17/2014, 2:32 PM  Clide Dales, PT Pager: [phone] 10/17/2014

## 2014-10-17 NOTE — Progress Notes (Signed)
CSW unable to obtain insurance authorization for [facility], today. Pt offered LOG for snf placement pending bcbs authorization at [facility] or [facility], however pt and wife refused. Pt spouse states she would prefer for patient to return home and wait for snf authorization. Pt spouse stated she would like the [place] and closest home health services agency. CSW consulted with RN CM. Pt spouse states they have walker and bedside commode. CSW consulted with EDP who is writing rx for new medications started upon discharge from [facility] admission.   Belia Heman, [place] Work  [employer] [phone]

## 2014-10-17 NOTE — Progress Notes (Signed)
CSW received consult for snf placement. CSW met with pt at bedside. Pt shared that he was recently at [facility] and discharged to [facility]. Pt shares that he did not feel as he was getting appropriate attention to his needs, especially in terms of getting to the bathroom to even getting a drink of water. Pt gave CSW permission to speak with pt spouse.   CSW spoke with pt spouse who also stated they were very unhappy with patient care at [facility]. CSW and wife discussed obudsman services. Patient wife however states she would prefer just another facility. CSW spoke with Georgiana Shore, liason for [facility] who plans to talk with pt spouse by phone to discuss further. Per Ms. Aikman, pt insurance authorization may be able to be transferred to [facility]. CSW also Systems analyst to see if any resources.   Belia Heman, [place] Work  [employer] [phone]

## 2014-10-17 NOTE — ED Provider Notes (Signed)
  Physical Exam  BP 168/75 mmHg  Pulse 79  Temp(Src) 98.3 F (36.8 C) (Oral)  Resp 18  SpO2 100%  Physical Exam  ED Course  Procedures  MDM Pt is going to go back home per pt and family and when bcbs assists with transfer to another facility. Pt and family has been working with social work and they have come to an agreed on this plan      Glendell Docker, NP 10/17/14 [place], MD 10/17/14 [phone]

## 2014-10-17 NOTE — Progress Notes (Signed)
CSW awaiting physical therapy evaluation to send to [facility]. Pt also pending further evaluation by EDP.   Jim [name], [place] Work  [employer] [phone]

## 2014-10-17 NOTE — Progress Notes (Signed)
PHARMACIST - PHYSICIAN COMMUNICATION Dr. Tomi Bamberger CONCERNING:  METFORMIN SAFE ADMINISTRATION POLICY  RECOMMENDATION: Metformin has been placed on DISCONTINUE (rejected order) STATUS and should be reordered only after any of the conditions below are ruled out.  Current safety recommendations include avoiding metformin for a minimum of 48 hours after the patient's exposure to intravenous contrast media.  DESCRIPTION:  The Pharmacy Committee has adopted a policy that restricts the use of metformin in hospitalized patients until all the contraindications to administration have been ruled out. Specific contraindications are: [x]  Serum creatinine ? 1.5 for males []  Serum creatinine ? 1.4 for females []  Shock, acute MI, sepsis, hypoxemia, dehydration []  Planned administration of intravenous iodinated contrast media []  Heart Failure patients with low EF []  Acute or chronic metabolic acidosis (including DKA)     Romeo Rabon, PharmD, pager [phone]. 10/17/2014,8:04 AM.

## 2014-10-17 NOTE — ED Notes (Signed)
PT at bedside.

## 2014-10-17 NOTE — Progress Notes (Signed)
CSW met with py at bedside. Wife was present. Wife states pt comes from home. She informed CSW that the two of them live in [place].   Wife states that she assist pt with completing his ADL's. She also says that she is the pt's primary support.  Patient informed CSW that he does not fall often. CSW consulted with Nurse CM who states that she has set up home health for the pt.  Wife states that she has no further questions at this time.  Willette Brace [phone] ED CSW 10/17/2014 4:51 PM

## 2014-10-18 ENCOUNTER — Ambulatory Visit (HOSPITAL_COMMUNITY): Payer: Medicare Other | Admitting: Physical Therapy

## 2014-10-24 ENCOUNTER — Telehealth: Payer: Self-pay

## 2014-10-24 DIAGNOSIS — N390 Urinary tract infection, site not specified: Secondary | ICD-10-CM | POA: Diagnosis not present

## 2014-10-24 NOTE — Telephone Encounter (Signed)
Noted  

## 2014-10-24 NOTE — Telephone Encounter (Signed)
Pt's wife called to let nurse know that patient is in nursing home at this time for at least the next 20 days and is unable to schedule a colonoscopy and will call back after he is back at home and schedule when he is able.

## 2014-10-25 ENCOUNTER — Ambulatory Visit (HOSPITAL_COMMUNITY): Payer: Medicare Other | Admitting: Physical Therapy

## 2014-10-27 ENCOUNTER — Ambulatory Visit (HOSPITAL_COMMUNITY): Payer: Medicare Other | Admitting: Physical Therapy

## 2014-11-07 NOTE — Therapy (Signed)
[place], [place], [address] Phone: [phone]   Fax:  [phone]  Patient Details  Name: Jahmad Petrich MRN: [id number] Date of Birth: [birth year] Referring Provider:  Petra Kuba, MD  Encounter Date: 10/11/2014  PHYSICAL THERAPY DISCHARGE SUMMARY  Visits from Start of Care:7  Current functional level related to goals / functional outcomes: Balance was improving   Remaining deficits: Had balance and strength deficits   Education / Equipment: HEP  Plan: Patient agrees to discharge.  Patient goals were partially met. Patient is being discharged due to a change in medical status.  ?????   Pt had CVA admitted to hospital then [facility]  [place], PT CLT [phone] 11/07/2014, 10:59 AM  [place], [place], [address] Phone: [phone]   Fax:  [phone]

## 2014-12-11 DIAGNOSIS — I69959 Hemiplegia and hemiparesis following unspecified cerebrovascular disease affecting unspecified side: Secondary | ICD-10-CM | POA: Diagnosis not present

## 2014-12-11 DIAGNOSIS — Z1211 Encounter for screening for malignant neoplasm of colon: Secondary | ICD-10-CM | POA: Diagnosis not present

## 2014-12-11 DIAGNOSIS — C61 Malignant neoplasm of prostate: Secondary | ICD-10-CM | POA: Diagnosis not present

## 2014-12-11 DIAGNOSIS — M6530 Trigger finger, unspecified finger: Secondary | ICD-10-CM | POA: Diagnosis not present

## 2014-12-11 DIAGNOSIS — E119 Type 2 diabetes mellitus without complications: Secondary | ICD-10-CM | POA: Diagnosis not present

## 2014-12-11 DIAGNOSIS — I10 Essential (primary) hypertension: Secondary | ICD-10-CM | POA: Diagnosis not present

## 2014-12-11 DIAGNOSIS — R7309 Other abnormal glucose: Secondary | ICD-10-CM | POA: Diagnosis not present

## 2015-01-10 DIAGNOSIS — C61 Malignant neoplasm of prostate: Secondary | ICD-10-CM | POA: Diagnosis not present

## 2015-01-10 DIAGNOSIS — N183 Chronic kidney disease, stage 3 (moderate): Secondary | ICD-10-CM | POA: Diagnosis not present

## 2015-01-10 DIAGNOSIS — I10 Essential (primary) hypertension: Secondary | ICD-10-CM | POA: Diagnosis not present

## 2015-01-10 DIAGNOSIS — I69959 Hemiplegia and hemiparesis following unspecified cerebrovascular disease affecting unspecified side: Secondary | ICD-10-CM | POA: Diagnosis not present

## 2015-01-10 DIAGNOSIS — Z1211 Encounter for screening for malignant neoplasm of colon: Secondary | ICD-10-CM | POA: Diagnosis not present

## 2015-01-10 DIAGNOSIS — M6530 Trigger finger, unspecified finger: Secondary | ICD-10-CM | POA: Diagnosis not present

## 2015-01-10 DIAGNOSIS — E119 Type 2 diabetes mellitus without complications: Secondary | ICD-10-CM | POA: Diagnosis not present

## 2015-01-11 ENCOUNTER — Encounter: Payer: Self-pay | Admitting: Orthopedic Surgery

## 2015-01-11 ENCOUNTER — Ambulatory Visit (INDEPENDENT_AMBULATORY_CARE_PROVIDER_SITE_OTHER): Payer: BLUE CROSS/BLUE SHIELD | Admitting: Orthopedic Surgery

## 2015-01-11 VITALS — BP 173/81 | Ht 68.0 in | Wt 197.0 lb

## 2015-01-11 DIAGNOSIS — I639 Cerebral infarction, unspecified: Secondary | ICD-10-CM

## 2015-01-11 DIAGNOSIS — M25362 Other instability, left knee: Secondary | ICD-10-CM

## 2015-01-11 DIAGNOSIS — M6530 Trigger finger, unspecified finger: Secondary | ICD-10-CM

## 2015-01-11 NOTE — Progress Notes (Signed)
Patient ID: Jim [name], male   DOB: 01-Oct-1950, 63 y.o.   MRN: [id number]  Chief Complaint  Patient presents with  . Hand Problem    left long finger trigger finger, ref. Jacqualine Mau    HPI Jim [name] is a 64 y.o. male.  The patient had a stroke about 4-5 months ago and since that time he has had progressively increasing pain over the left long finger over the A1 pulley with catching locking but she has to manually reduce. Says the pain is severe at times. The quality of the pain is a dull ache  Pertinent review of systems he denies fever or trauma, he denies any lacerations to his finger.  Review of Systems Review of Systems  Past Medical History  Diagnosis Date  . Hypertension   . Diabetes mellitus   . Chronic kidney disease     cyst on left kidney   . GERD (gastroesophageal reflux disease)   . Degenerative disc disease, lumbar   . Prostate cancer 06/11/12    gleason 7  . Hx of radiation therapy 12/13/12- 01/27/13    prostate bed 6600 cGy 33 sessions  . Stroke     Past Surgical History  Procedure Laterality Date  . Robot assisted laparoscopic radical prostatectomy  06/11/2012    Procedure: ROBOTIC ASSISTED LAPAROSCOPIC RADICAL PROSTATECTOMY;  Surgeon: Alexis Frock, MD;  Location: [facility] ORS;  Service: Urology;  Laterality: N/A;  1st procedure: Robotic Left Renal Cyst Decortication   . Lymphadenectomy  06/11/2012    Procedure: LYMPHADENECTOMY;  Surgeon: Alexis Frock, MD;  Location: [facility] ORS;  Service: Urology;  Laterality: Bilateral;  . Robotic assited partial nephrectomy  06/11/2012    Procedure: ROBOTIC ASSITED PARTIAL NEPHRECTOMY;  Surgeon: Alexis Frock, MD;  Location: [facility] ORS;  Service: Urology;  Laterality: Left;  . Prostate biopsy  03/25/2012    Family History  Problem Relation Age of Onset  . Cancer Mother     pancreatic  . Cancer Father     lung  . Cancer Brother     prostate-surgery 6 years ago    Social History Social History  Substance Use Topics   . Smoking status: Never Smoker   . Smokeless tobacco: None  . Alcohol Use: No     Comment: no use in 1 year per patient     No Known Allergies  Current Outpatient Prescriptions  Medication Sig Dispense Refill  . amLODipine (NORVASC) 10 MG tablet Take 1 tablet (10 mg total) by mouth every morning. 30 tablet 1  . aspirin 325 MG tablet Take 325 mg by mouth daily.    Marland Kitchen atorvastatin (LIPITOR) 80 MG tablet Take 1 tablet (80 mg total) by mouth daily at 6 PM. 30 tablet 1  . ciprofloxacin (CIPRO) 500 MG tablet Take 1 tablet (500 mg total) by mouth 2 (two) times daily. 14 tablet 0  . ciprofloxacin (CIPRO) 500 MG tablet Take 1 tablet (500 mg total) by mouth 2 (two) times daily. 14 tablet 0  . clopidogrel (PLAVIX) 75 MG tablet Take 1 tablet (75 mg total) by mouth daily. 30 tablet 1  . glipiZIDE (GLUCOTROL XL) 10 MG 24 hr tablet Take 10 mg by mouth daily with breakfast.    . insulin detemir (LEVEMIR) 100 UNIT/ML injection Inject 0.13 mLs (13 Units total) into the skin daily. 10 mL 11  . metFORMIN (GLUCOPHAGE) 500 MG tablet Take 1 tablet (500 mg total) by mouth 2 (two) times daily with a meal. 30 tablet 0  .  metFORMIN (GLUCOPHAGE-XR) 500 MG 24 hr tablet Take 1 tablet (500 mg total) by mouth 2 (two) times daily. (Patient taking differently: Take 1,000 mg by mouth 2 (two) times daily. ) 30 tablet 1  . metoprolol succinate (TOPROL-XL) 50 MG 24 hr tablet Take 1 tablet (50 mg total) by mouth daily. 30 tablet 1  . oxyCODONE-acetaminophen (PERCOCET/ROXICET) 5-325 MG per tablet Take 1 tablet by mouth every 6 (six) hours as needed for severe pain. 10 tablet 0  . ranitidine (ZANTAC) 150 MG capsule Take 1 capsule by mouth 2 (two) times daily as needed for heartburn.   0   No current facility-administered medications for this visit.       Physical Exam Physical Exam Blood pressure 173/81, height 5\' 8"  (1.727 m), weight 197 lb (89.359 kg).  Data Reviewed No imaging studies were required  Assessment     Trigger finger left long finger    Plan    Injection trigger finger  Trigger finger injection  Diagnosis left long finger trigger Procedure injection A1 pulley Medications lidocaine 1% 1 mL and Depo-Medrol 40 mg 1 mL Skin prep alcohol and ethyl chloride Verbal consent was obtained Timeout confirmed the injection site  After cleaning the skin with alcohol and anesthetizing the skin with ethyl chloride the A1 pulley was palpated and the injection was performed without complication       Arther Abbott 01/11/2015, 10:54 AM

## 2015-01-11 NOTE — Patient Instructions (Addendum)
Prescription given for left knee brace  Trigger Finger Trigger finger (digital tendinitis and stenosing tenosynovitis) is a common disorder that causes an often painful catching of the fingers or thumb. It occurs as a clicking, snapping, or locking of a finger in the palm of the hand. This is caused by a problem with the tendons that flex or bend the fingers sliding smoothly through their sheaths. The condition may occur in any finger or a couple fingers at the same time.  The finger may lock with the finger curled or suddenly straighten out with a snap. This is more common in patients with rheumatoid arthritis and diabetes. Left untreated, the condition may get worse to the point where the finger becomes locked in flexion, like making a fist, or less commonly locked with the finger straightened out. CAUSES   Inflammation and scarring that lead to swelling around the tendon sheath.  Repeated or forceful movements.  Rheumatoid arthritis, an autoimmune disease that affects joints.  Gout.  Diabetes mellitus. SIGNS AND SYMPTOMS  Soreness and swelling of your finger.  A painful clicking or snapping as you bend and straighten your finger. DIAGNOSIS  Your health care provider will do a physical exam of your finger to diagnose trigger finger. TREATMENT   Splinting for 6-8 weeks may be helpful.  Nonsteroidal anti-inflammatory medicines (NSAIDs) can help to relieve the pain and inflammation.  Cortisone injections, along with splinting, may speed up recovery. Several injections may be required. Cortisone may give relief after one injection.  Surgery is another treatment that may be used if conservative treatments do not work. Surgery can be minor, without incisions (a cut does not have to be made), and can be done with a needle through the skin.  Other surgical choices involve an open procedure in which the surgeon opens the hand through a small incision and cuts the pulley so the tendon can  again slide smoothly. Your hand will still work fine. HOME CARE INSTRUCTIONS  Apply ice to the injured area, twice per day:  Put ice in a plastic bag.  Place a towel between your skin and the bag.  Leave the ice on for 20 minutes, 3-4 times a day.  Rest your hand often. MAKE SURE YOU:   Understand these instructions.  Will watch your condition.  Will get help right away if you are not doing well or get worse. Document Released: 03/01/2004 Document Revised: 01/12/2013 Document Reviewed: 10/12/2012 [facility] Patient Information 2015 [place], [place]. This information is not intended to replace advice given to you by your health care provider. Make sure you discuss any questions you have with your health care provider.

## 2015-01-18 ENCOUNTER — Telehealth: Payer: Self-pay | Admitting: Orthopedic Surgery

## 2015-01-18 ENCOUNTER — Emergency Department (HOSPITAL_COMMUNITY)
Admission: EM | Admit: 2015-01-18 | Discharge: 2015-01-19 | Disposition: A | Discharge status: Home / Self Care | Payer: BLUE CROSS/BLUE SHIELD | Attending: Emergency Medicine | Admitting: Emergency Medicine

## 2015-01-18 ENCOUNTER — Encounter (HOSPITAL_COMMUNITY): Payer: Self-pay | Admitting: Emergency Medicine

## 2015-01-18 DIAGNOSIS — Z79899 Other long term (current) drug therapy: Secondary | ICD-10-CM | POA: Diagnosis not present

## 2015-01-18 DIAGNOSIS — M549 Dorsalgia, unspecified: Secondary | ICD-10-CM | POA: Insufficient documentation

## 2015-01-18 DIAGNOSIS — Z794 Long term (current) use of insulin: Secondary | ICD-10-CM | POA: Insufficient documentation

## 2015-01-18 DIAGNOSIS — M79642 Pain in left hand: Secondary | ICD-10-CM

## 2015-01-18 DIAGNOSIS — K219 Gastro-esophageal reflux disease without esophagitis: Secondary | ICD-10-CM | POA: Insufficient documentation

## 2015-01-18 DIAGNOSIS — Z8673 Personal history of transient ischemic attack (TIA), and cerebral infarction without residual deficits: Secondary | ICD-10-CM | POA: Insufficient documentation

## 2015-01-18 DIAGNOSIS — I129 Hypertensive chronic kidney disease with stage 1 through stage 4 chronic kidney disease, or unspecified chronic kidney disease: Secondary | ICD-10-CM | POA: Insufficient documentation

## 2015-01-18 DIAGNOSIS — Z8546 Personal history of malignant neoplasm of prostate: Secondary | ICD-10-CM | POA: Diagnosis not present

## 2015-01-18 DIAGNOSIS — M79643 Pain in unspecified hand: Secondary | ICD-10-CM | POA: Diagnosis present

## 2015-01-18 DIAGNOSIS — N189 Chronic kidney disease, unspecified: Secondary | ICD-10-CM | POA: Insufficient documentation

## 2015-01-18 DIAGNOSIS — M65332 Trigger finger, left middle finger: Secondary | ICD-10-CM | POA: Diagnosis not present

## 2015-01-18 DIAGNOSIS — Z7982 Long term (current) use of aspirin: Secondary | ICD-10-CM | POA: Insufficient documentation

## 2015-01-18 LAB — CBG MONITORING, ED: GLUCOSE-CAPILLARY: 115 mg/dL — AB (ref 65–99)

## 2015-01-18 MED ORDER — HYDROCODONE-ACETAMINOPHEN 5-325 MG PO TABS: 1.0000 | ORAL_TABLET | ORAL | Status: DC | PRN

## 2015-01-18 MED ORDER — PREGABALIN 150 MG PO CAPS: 150.0000 mg | ORAL_CAPSULE | Freq: Two times a day (BID) | ORAL | Status: DC

## 2015-01-18 NOTE — Discharge Instructions (Signed)
Please see Dr Preston Fleeting for evaluation of possible diabetic neuropathy. See Dr Aline Brochure for evaluation of the possible bony cyst of the left middle finger. Use lyrica daily. Use tylenol for mild pain. Use norco for more severe pain.

## 2015-01-18 NOTE — Telephone Encounter (Signed)
Jim [name] is calling requesting for pain medication states that he has a pain of a 10 especially at night he states that his pain keeps him awake at night, please advise?

## 2015-01-18 NOTE — Telephone Encounter (Signed)
Home health nurse is calling stating that she is at the home of Jim [name] and she sees that he is in pain, I explained to her that Dr. Aline Brochure is out of the office until next week and spoke to our nurse York Cerise and she advised Jim [name] should go to the ER and that was relayed to the [place] Nurse.

## 2015-01-18 NOTE — ED Notes (Signed)
Glucose 115

## 2015-01-18 NOTE — ED Notes (Signed)
Pt was treated for trigger finger on the 18th, pain and stiffness went away.  Saturday night started having tingling/stabbing in fingers

## 2015-01-18 NOTE — ED Provider Notes (Signed)
CSN: [id number]     Arrival date & time 01/18/15  2145 History   First MD Initiated Contact with Patient 01/18/15 2229     Chief Complaint  Patient presents with  . Hand Pain     (Consider location/radiation/quality/duration/timing/severity/associated sxs/prior Treatment) Patient is a 64 y.o. male presenting with hand pain. The history is provided by the patient.  Hand Pain This is a new problem. The current episode started in the past 7 days. The problem occurs intermittently. The problem has been gradually worsening. Associated symptoms include arthralgias and numbness. Pertinent negatives include no rash. Associated symptoms comments: tingling. Nothing aggravates the symptoms. He has tried nothing for the symptoms. The treatment provided no relief.    Past Medical History  Diagnosis Date  . Hypertension   . Diabetes mellitus   . Chronic kidney disease     cyst on left kidney   . GERD (gastroesophageal reflux disease)   . Degenerative disc disease, lumbar   . Prostate cancer 06/11/12    gleason 7  . Hx of radiation therapy 12/13/12- 01/27/13    prostate bed 6600 cGy 33 sessions  . Stroke    Past Surgical History  Procedure Laterality Date  . Robot assisted laparoscopic radical prostatectomy  06/11/2012    Procedure: ROBOTIC ASSISTED LAPAROSCOPIC RADICAL PROSTATECTOMY;  Surgeon: Alexis Frock, MD;  Location: [facility];  Service: Urology;  Laterality: N/A;  1st procedure: Robotic Left Renal Cyst Decortication   . Lymphadenectomy  06/11/2012    Procedure: LYMPHADENECTOMY;  Surgeon: Alexis Frock, MD;  Location: [facility];  Service: Urology;  Laterality: Bilateral;  . Robotic assited partial nephrectomy  06/11/2012    Procedure: ROBOTIC ASSITED PARTIAL NEPHRECTOMY;  Surgeon: Alexis Frock, MD;  Location: [facility];  Service: Urology;  Laterality: Left;  . Prostate biopsy  03/25/2012   Family History  Problem Relation Age of Onset  . Cancer Mother     pancreatic  . Cancer Father     lung   . Cancer Brother     prostate-surgery 6 years ago   Social History  Substance Use Topics  . Smoking status: Never Smoker   . Smokeless tobacco: None  . Alcohol Use: No     Comment: no use in 1 year per patient     Review of Systems  Musculoskeletal: Positive for back pain and arthralgias.  Skin: Negative for rash.  Neurological: Positive for numbness.       Tingling  All other systems reviewed and are negative.     Allergies  Review of patient's allergies indicates no known allergies.  Home Medications   Prior to Admission medications   Medication Sig Start Date End Date Taking? Authorizing Provider  amLODipine (NORVASC) 10 MG tablet Take 1 tablet (10 mg total) by mouth every morning. 09/08/14  Yes Orvan Falconer, MD  aspirin EC 81 MG tablet Take 162 mg by mouth daily.   Yes Historical Provider, MD  atorvastatin (LIPITOR) 80 MG tablet Take 1 tablet (80 mg total) by mouth daily at 6 PM. 09/08/14  Yes Orvan Falconer, MD  diclofenac sodium (VOLTAREN) 1 % GEL Apply 2 g topically 4 (four) times daily as needed (for pain).  01/03/15  Yes Historical Provider, MD  docusate sodium (COLACE) 100 MG capsule Take 100 mg by mouth every evening.   Yes Historical Provider, MD  glipiZIDE (GLUCOTROL XL) 10 MG 24 hr tablet Take 10 mg by mouth daily with breakfast.   Yes Historical Provider, MD  hydrochlorothiazide (HYDRODIURIL) 25  MG tablet Take 25 mg by mouth daily.   Yes Historical Provider, MD  insulin detemir (LEVEMIR) 100 UNIT/ML injection Inject 0.13 mLs (13 Units total) into the skin daily. Patient taking differently: Inject 8 Units into the skin daily.  09/07/14  Yes Lezlie Octave Black, NP  metFORMIN (GLUCOPHAGE-XR) 500 MG 24 hr tablet Take 1 tablet (500 mg total) by mouth 2 (two) times daily. Patient taking differently: Take 1,000 mg by mouth 2 (two) times daily.  10/14/14  Yes Kathie Dike, MD  metoprolol succinate (TOPROL-XL) 50 MG 24 hr tablet Take 1 tablet (50 mg total) by mouth daily. Patient taking  differently: Take 100 mg by mouth daily.  09/08/14  Yes Orvan Falconer, MD  ranitidine (ZANTAC) 150 MG capsule Take 1 capsule by mouth 2 (two) times daily as needed for heartburn.  09/14/14  Yes Historical Provider, MD   BP 187/77 mmHg  Pulse 73  Temp(Src) 98.1 F (36.7 C) (Oral)  Resp 18  Ht 5\' 8"  (1.727 m)  Wt 198 lb (89.812 kg)  BMI 30.11 kg/m2  SpO2 100% Physical Exam  Constitutional: He is oriented to person, place, and time. He appears well-developed and well-nourished.  Non-toxic appearance.  HENT:  Head: Normocephalic.  Right Ear: Tympanic membrane and external ear normal.  Left Ear: Tympanic membrane and external ear normal.  Eyes: EOM and lids are normal. Pupils are equal, round, and reactive to light.  Neck: Normal range of motion. Neck supple. Carotid bruit is not present.  Cardiovascular: Normal rate, regular rhythm, normal heart sounds, intact distal pulses and normal pulses.   Pulmonary/Chest: Breath sounds normal. No respiratory distress.  Abdominal: Soft. Bowel sounds are normal. There is no tenderness. There is no guarding.  Musculoskeletal: Normal range of motion.  There are degenerative changes of the right and left upper extremities. Pt has trigger finger of the left middle finger. Cap refill is less than 2 sec.. Radial pulse is 2+. No temp changes of the right or left upper extremities.  Lymphadenopathy:       Head (right side): No submandibular adenopathy present.       Head (left side): No submandibular adenopathy present.    He has no cervical adenopathy.  Neurological: He is alert and oriented to person, place, and time. He has normal strength. No cranial nerve deficit or sensory deficit.  Skin: Skin is warm and dry.  Psychiatric: He has a normal mood and affect. His speech is normal.  Nursing note and vitals reviewed.   ED Course  Procedures (including critical care time) Labs Review Labs Reviewed  CBG MONITORING, ED - Abnormal; Notable for the following:     Glucose-Capillary 115 (*)    All other components within normal limits  CBG MONITORING, ED    Imaging Review No results found. I have personally reviewed and evaluated these images and lab results as part of my medical decision-making.   EKG Interpretation None      MDM  The exam favors diabetic neuropathy. There is a question of a bony cyst at the base of the third left finger. Pt to be treated with lyrica and norco. He is to see Dr Aline Brochure again concerning this pain.   Final diagnoses:  None    *I have reviewed nursing notes, vital signs, and all appropriate lab and imaging results for this patient.[address], PA-C 01/18/15 2333  Milton Ferguson, MD 01/22/15 [phone]

## 2015-01-18 NOTE — Telephone Encounter (Signed)
Advised Dr Luna Glasgow on call for emergencies, but if pain is at a 10 patient she go to ER.

## 2015-01-24 MED FILL — Hydrocodone-Acetaminophen Tab 5-325 MG: ORAL | Qty: 6 | Status: AC

## 2015-01-30 ENCOUNTER — Ambulatory Visit: Payer: BLUE CROSS/BLUE SHIELD

## 2015-01-30 ENCOUNTER — Ambulatory Visit (INDEPENDENT_AMBULATORY_CARE_PROVIDER_SITE_OTHER): Payer: BLUE CROSS/BLUE SHIELD

## 2015-01-30 ENCOUNTER — Encounter: Payer: Self-pay | Admitting: Orthopedic Surgery

## 2015-01-30 ENCOUNTER — Ambulatory Visit (INDEPENDENT_AMBULATORY_CARE_PROVIDER_SITE_OTHER): Payer: BLUE CROSS/BLUE SHIELD | Admitting: Orthopedic Surgery

## 2015-01-30 VITALS — BP 178/81 | Ht 68.0 in | Wt 198.0 lb

## 2015-01-30 DIAGNOSIS — G5602 Carpal tunnel syndrome, left upper limb: Secondary | ICD-10-CM

## 2015-01-30 DIAGNOSIS — M79641 Pain in right hand: Secondary | ICD-10-CM

## 2015-01-30 DIAGNOSIS — M79642 Pain in left hand: Secondary | ICD-10-CM

## 2015-01-30 DIAGNOSIS — I639 Cerebral infarction, unspecified: Secondary | ICD-10-CM

## 2015-01-30 MED ORDER — GABAPENTIN 100 MG PO CAPS: 100.0000 mg | ORAL_CAPSULE | Freq: Three times a day (TID) | ORAL | Status: DC

## 2015-01-30 MED ORDER — VITAMIN B-6 100 MG PO TABS: 100.0000 mg | ORAL_TABLET | Freq: Two times a day (BID) | ORAL | Status: DC

## 2015-01-30 NOTE — Progress Notes (Signed)
Patient ID: Jim [name], male   DOB: December 20, 1950, 64 y.o.   MRN: [id number]  Established patient new problem or a graft chief complaint pain numbness and tingling left hand  History 64 year old male had a stroke affected his left side we treated him for trigger finger with injection did well. Presents with a 6 week history now of severe pain burning numbness tingling in the left hand and wrist radiating from the wrist down.  Review of systems denies shoulder elbow or neck pain denies any numbness or tingling above the wrist. Denies warmth or erythema in the area  Past Medical History  Diagnosis Date  . Hypertension   . Diabetes mellitus   . Chronic kidney disease     cyst on left kidney   . GERD (gastroesophageal reflux disease)   . Degenerative disc disease, lumbar   . Prostate cancer 06/11/12    gleason 7  . Hx of radiation therapy 12/13/12- 01/27/13    prostate bed 6600 cGy 33 sessions  . Stroke     Current outpatient prescriptions:  .  amLODipine (NORVASC) 10 MG tablet, Take 1 tablet (10 mg total) by mouth every morning., Disp: 30 tablet, Rfl: 1 .  aspirin EC 81 MG tablet, Take 162 mg by mouth daily., Disp: , Rfl:  .  atorvastatin (LIPITOR) 80 MG tablet, Take 1 tablet (80 mg total) by mouth daily at 6 PM., Disp: 30 tablet, Rfl: 1 .  diclofenac sodium (VOLTAREN) 1 % GEL, Apply 2 g topically 4 (four) times daily as needed (for pain). , Disp: , Rfl: 5 .  docusate sodium (COLACE) 100 MG capsule, Take 100 mg by mouth every evening., Disp: , Rfl:  .  gabapentin (NEURONTIN) 100 MG capsule, Take 1 capsule (100 mg total) by mouth 3 (three) times daily., Disp: 90 capsule, Rfl: 1 .  glipiZIDE (GLUCOTROL XL) 10 MG 24 hr tablet, Take 10 mg by mouth daily with breakfast., Disp: , Rfl:  .  hydrochlorothiazide (HYDRODIURIL) 25 MG tablet, Take 25 mg by mouth daily., Disp: , Rfl:  .  HYDROcodone-acetaminophen (NORCO) 5-325 MG per tablet, Take 1 tablet by mouth every 4 (four) hours as needed for  moderate pain., Disp: 15 tablet, Rfl: 0 .  insulin detemir (LEVEMIR) 100 UNIT/ML injection, Inject 0.13 mLs (13 Units total) into the skin daily. (Patient taking differently: Inject 8 Units into the skin daily. ), Disp: 10 mL, Rfl: 11 .  metFORMIN (GLUCOPHAGE-XR) 500 MG 24 hr tablet, Take 1 tablet (500 mg total) by mouth 2 (two) times daily. (Patient taking differently: Take 1,000 mg by mouth 2 (two) times daily. ), Disp: 30 tablet, Rfl: 1 .  metoprolol succinate (TOPROL-XL) 50 MG 24 hr tablet, Take 1 tablet (50 mg total) by mouth daily. (Patient taking differently: Take 100 mg by mouth daily. ), Disp: 30 tablet, Rfl: 1 .  pregabalin (LYRICA) 150 MG capsule, Take 1 capsule (150 mg total) by mouth 2 (two) times daily. (Patient not taking: Reported on 01/30/2015), Disp: 20 capsule, Rfl: 0 .  pyridOXINE (VITAMIN B-6) 100 MG tablet, Take 1 tablet (100 mg total) by mouth 2 (two) times daily., Disp: 90 tablet, Rfl: 0 .  ranitidine (ZANTAC) 150 MG capsule, Take 1 capsule by mouth 2 (two) times daily as needed for heartburn. , Disp: , Rfl: 0   BP 178/81 mmHg  Ht 5\' 8"  (1.727 m)  Wt 198 lb (89.812 kg)  BMI 30.11 kg/m2 Gen. appearance is normal. The patient uses a cane. He is  oriented 3 his mood is flat his affect is flat  Endplate with a cane  He has no significant swelling in the left hand it seems like it may be a little bit. He has normal range of motion at the wrist and he can make a full fist but it's not tight. Weakness noted on grip. Wrists stable. No catching locking or triggering at this time no atrophy in the thenar or hyperthenar areas  Skin is intact without laceration ulceration or erythema. He has normal pulses perfusion capillary refill and temperature is normal lymph nodes are negative. He has decreased sensation in the median nerve distribution with tenderness over the carpal tunnel exacerbation of the numbness with flexion and pressure  X-rays are normal 3 views left hand. Please see my  report  Impression carpal tunnel syndrome left  Plan  Carpal tunnel splint  B-6 100 mg twice a day  Gabapentin 100 mg 3 times a day  Return 6 weeks recheck

## 2015-01-30 NOTE — Patient Instructions (Signed)
Brace x 6 weeks  Two meds sent to your pharmacy

## 2015-02-16 ENCOUNTER — Telehealth: Payer: Self-pay | Admitting: Orthopedic Surgery

## 2015-02-16 NOTE — Telephone Encounter (Signed)
ROUTING TO DR HARRISON 

## 2015-02-16 NOTE — Telephone Encounter (Signed)
Patient is complaining of hand pain, he states that he cant sleep because of the pain, he is asking for something to take for pain, please advise?

## 2015-02-19 NOTE — Telephone Encounter (Signed)
Call his primary care doctor is on too many medications for me to intervene at this point

## 2015-02-19 NOTE — Telephone Encounter (Signed)
Patient aware.

## 2015-02-28 ENCOUNTER — Telehealth: Payer: Self-pay | Admitting: Orthopedic Surgery

## 2015-02-28 NOTE — Telephone Encounter (Signed)
Patient is calling once again stating that he is having a lot of hand pain along with the burning and it keeps him awake at night, I explained to him Dr. Althia Forts advise from Previous phone note to contact his PCP due to the Many medications that he is on, he stated that Dr. Leona Singleton told him that he would need to refer back to Dr. Aline Brochure for Pain relief please advise?

## 2015-02-28 NOTE — Telephone Encounter (Signed)
Ok last time I m saying this:  (not your fault)   NO

## 2015-03-01 ENCOUNTER — Telehealth: Payer: Self-pay | Admitting: Orthopedic Surgery

## 2015-03-01 NOTE — Telephone Encounter (Signed)
Patient is aware of Dr. Althia Forts recommendation

## 2015-03-06 ENCOUNTER — Telehealth: Payer: Self-pay | Admitting: Orthopedic Surgery

## 2015-03-06 NOTE — Telephone Encounter (Signed)
Patient aware.

## 2015-03-06 NOTE — Telephone Encounter (Signed)
Yes 200 mg tid

## 2015-03-06 NOTE — Telephone Encounter (Signed)
Routing to Dr Harrison for review 

## 2015-03-06 NOTE — Telephone Encounter (Signed)
Patients wife is asking if Jim [name] may increase the dose of gabapentin (NEURONTIN) 100 MG capsule please advise?

## 2015-03-13 ENCOUNTER — Ambulatory Visit (INDEPENDENT_AMBULATORY_CARE_PROVIDER_SITE_OTHER): Payer: BLUE CROSS/BLUE SHIELD | Admitting: Orthopedic Surgery

## 2015-03-13 ENCOUNTER — Encounter: Payer: Self-pay | Admitting: Orthopedic Surgery

## 2015-03-13 VITALS — BP 166/76 | Ht 68.0 in | Wt 198.0 lb

## 2015-03-13 DIAGNOSIS — G5602 Carpal tunnel syndrome, left upper limb: Secondary | ICD-10-CM | POA: Diagnosis not present

## 2015-03-13 DIAGNOSIS — I639 Cerebral infarction, unspecified: Secondary | ICD-10-CM

## 2015-03-13 MED ORDER — GABAPENTIN 100 MG PO CAPS: 200.0000 mg | ORAL_CAPSULE | Freq: Three times a day (TID) | ORAL | Status: DC

## 2015-03-13 NOTE — Progress Notes (Signed)
Patient ID: Jim [name], male   DOB: 07-20-1950, 64 y.o.   MRN: [id number]  Follow up visit  Chief Complaint  Patient presents with  . Follow-up    follow up CTS Left hand    BP 166/76 mmHg  Ht 5\' 8"  (1.727 m)  Wt 198 lb (89.812 kg)  BMI 30.11 kg/m2   History 64 year old male had a stroke affected his left side we treated him for trigger finger with injection did well. Presents with a 6 week history now of severe pain burning numbness tingling in the left hand and wrist radiating from the wrist down.  Review of systems denies shoulder elbow or neck pain denies any numbness or tingling above the wrist. Denies warmth or erythema in the area, no change since 01/30/2015  Carpal tunnel syndrome left  Patient comes in for follow-up after treating carpal tunnel syndrome with splinting and gabapentin and vitamin B 6 with improved symptoms at 200 mg gabapentin 3 times a day  We will continue this medication at this time  Follow-up in 3 months or sooner if his symptoms worsen. He may need carpal tunnel release.   This is the copy of the recent evaluation of Jim [name]. We do not recommend hydrocodone for carpal tunnel syndrome. If you feel that he needs this medication please feel free to prescribe it but we will not continue the prescription for treatment of carpal tunnel syndrome  He is currently managed on 200 mg of gabapentin 3 times a day in a wrist splint at night.  Meds ordered this encounter  Medications  . gabapentin (NEURONTIN) 100 MG capsule    Sig: Take 2 capsules (200 mg total) by mouth 3 (three) times daily.    Dispense:  180 capsule    Refill:  2

## 2015-03-13 NOTE — Patient Instructions (Signed)
GABAPENTIN REFILL SENT TO YOUR PHARMACY  CONTINUE WEARING BRACE AT NIGHT

## 2015-06-14 ENCOUNTER — Ambulatory Visit (INDEPENDENT_AMBULATORY_CARE_PROVIDER_SITE_OTHER): Payer: BLUE CROSS/BLUE SHIELD | Admitting: Orthopedic Surgery

## 2015-06-14 ENCOUNTER — Encounter: Payer: Self-pay | Admitting: Orthopedic Surgery

## 2015-06-14 VITALS — BP 160/71 | Ht 68.0 in | Wt 198.0 lb

## 2015-06-14 DIAGNOSIS — G5602 Carpal tunnel syndrome, left upper limb: Secondary | ICD-10-CM

## 2015-06-14 MED ORDER — TRAMADOL HCL 50 MG PO TABS: 50.0000 mg | ORAL_TABLET | Freq: Every day | ORAL | Status: DC

## 2015-06-14 NOTE — Patient Instructions (Signed)
We will refer to Dr Merlene Laughter for nerve conduction study

## 2015-06-14 NOTE — Progress Notes (Signed)
This is a follow-up visit for presumed left carpal tunnel syndrome. This patient has been splinted has gabapentin and then Lyrica as well as B-6 with improvement in symptoms initially but continues now with pain paresthesias he's not had a nerve conduction study  We noted on previous visits that his medication did help him but now his symptoms have worsened so were sending him for nerve conduction study. On follow-up we will preop him if this study is positive

## 2015-06-18 ENCOUNTER — Telehealth: Payer: Self-pay | Admitting: *Deleted

## 2015-06-18 NOTE — Telephone Encounter (Signed)
REFERRAL FAXED TO DR [facility]

## 2015-07-04 NOTE — Telephone Encounter (Signed)
appt 08/02/15 @ Dr Merlene Laughter

## 2015-08-08 DIAGNOSIS — E1165 Type 2 diabetes mellitus with hyperglycemia: Secondary | ICD-10-CM | POA: Diagnosis not present

## 2015-08-08 DIAGNOSIS — G5600 Carpal tunnel syndrome, unspecified upper limb: Secondary | ICD-10-CM | POA: Diagnosis not present

## 2015-08-08 DIAGNOSIS — I6990 Unspecified sequelae of unspecified cerebrovascular disease: Secondary | ICD-10-CM | POA: Diagnosis not present

## 2015-08-08 DIAGNOSIS — R209 Unspecified disturbances of skin sensation: Secondary | ICD-10-CM | POA: Diagnosis not present

## 2015-08-08 DIAGNOSIS — R2681 Unsteadiness on feet: Secondary | ICD-10-CM | POA: Diagnosis not present

## 2015-08-08 DIAGNOSIS — I10 Essential (primary) hypertension: Secondary | ICD-10-CM | POA: Diagnosis not present

## 2015-08-23 DIAGNOSIS — C61 Malignant neoplasm of prostate: Secondary | ICD-10-CM | POA: Diagnosis not present

## 2015-08-30 DIAGNOSIS — N5201 Erectile dysfunction due to arterial insufficiency: Secondary | ICD-10-CM | POA: Diagnosis not present

## 2015-08-30 DIAGNOSIS — C61 Malignant neoplasm of prostate: Secondary | ICD-10-CM | POA: Diagnosis not present

## 2015-08-30 DIAGNOSIS — N281 Cyst of kidney, acquired: Secondary | ICD-10-CM | POA: Diagnosis not present

## 2015-08-30 DIAGNOSIS — N189 Chronic kidney disease, unspecified: Secondary | ICD-10-CM | POA: Diagnosis not present

## 2015-08-30 DIAGNOSIS — N393 Stress incontinence (female) (male): Secondary | ICD-10-CM | POA: Diagnosis not present

## 2015-09-09 IMAGING — CT CT ABD-PELV W/O CM
2 of 4 series · 16 of 46 positions shown, 18 images · non-contrast
Comparison: None.

CLINICAL DATA: Gross hematuria.

EXAM:
CT ABDOMEN AND PELVIS WITHOUT CONTRAST
TECHNIQUE: Multidetector CT imaging of the abdomen and pelvis was performed
following the standard protocol without IV contrast.

[Series 2: abdomen/pelvis w/o contrast · axial · non-contrast · 0.79mm/px · z∈[-580,-145]mm · 13 of 95 slices shown, 15 images]
[im 4/95  soft-tissue]
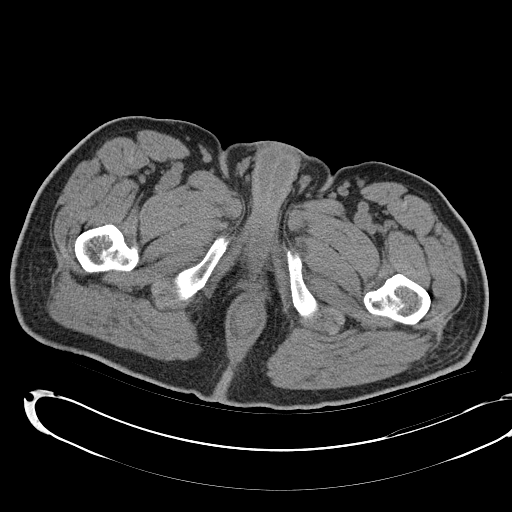
[im 4/95  bone]
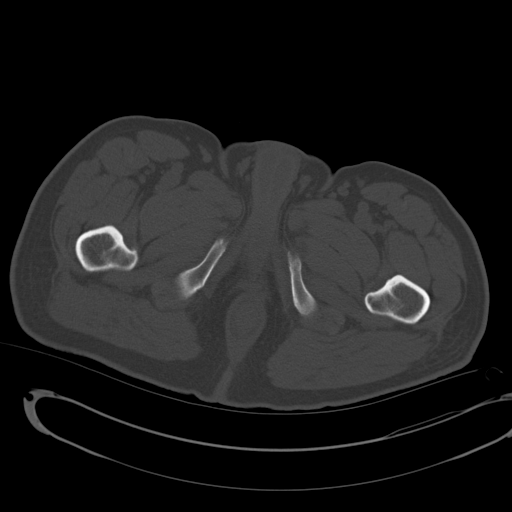
[im 12/95  soft-tissue]
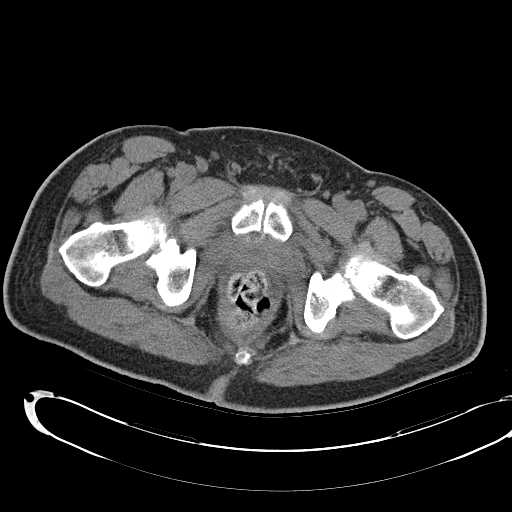
[im 20/95  soft-tissue]
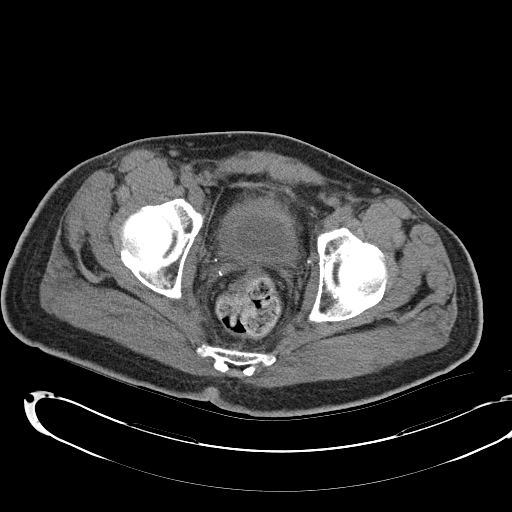
[im 28/95  soft-tissue]
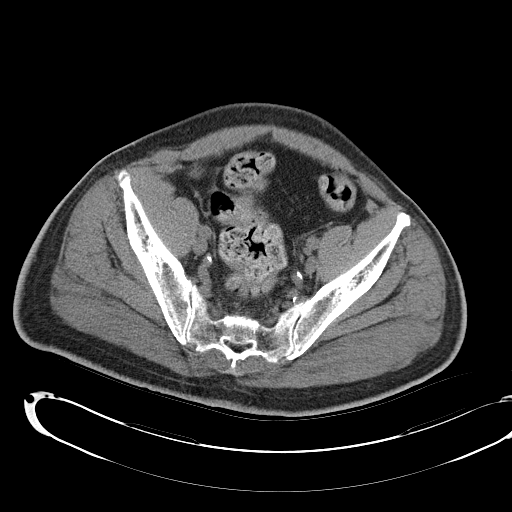
[im 32/95  soft-tissue]
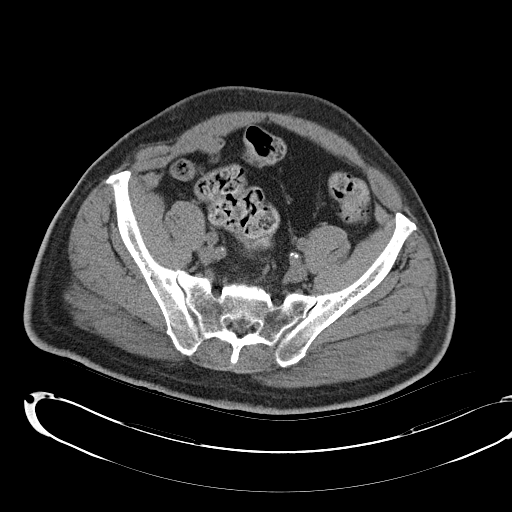
[im 40/95  soft-tissue]
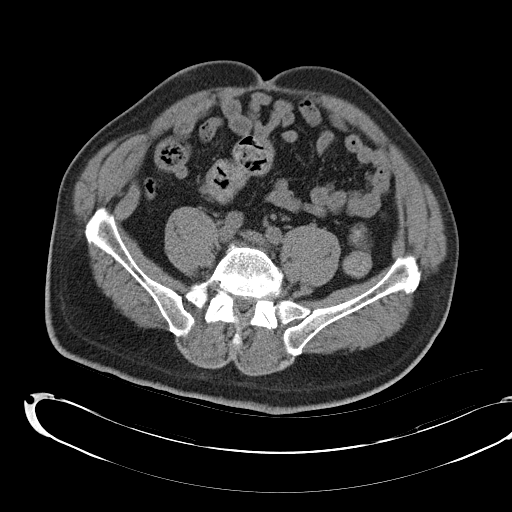
[im 48/95  soft-tissue]
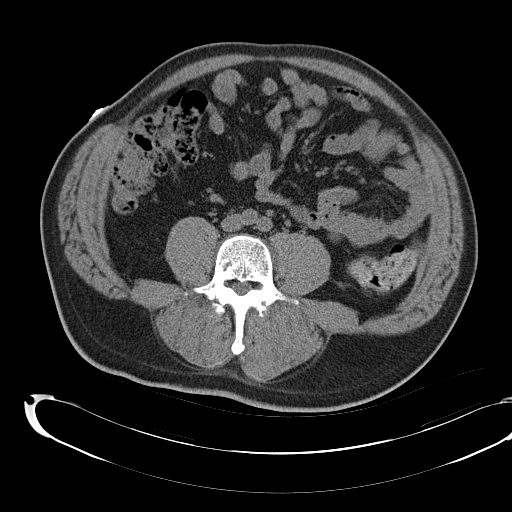
[im 55/95  soft-tissue]
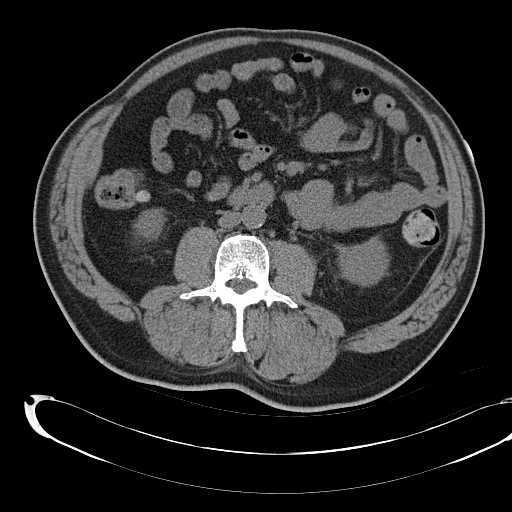
[im 63/95  soft-tissue]
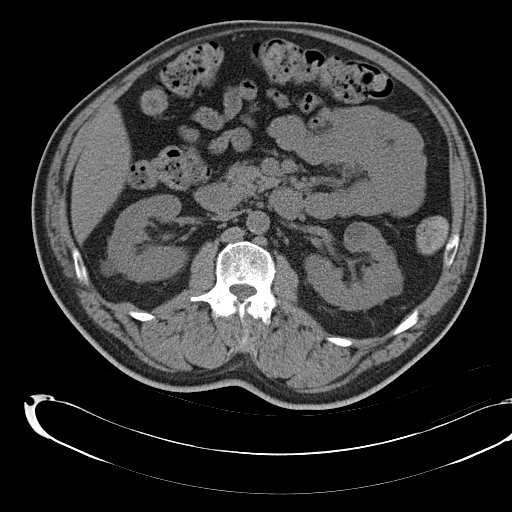
[im 63/95  bone]
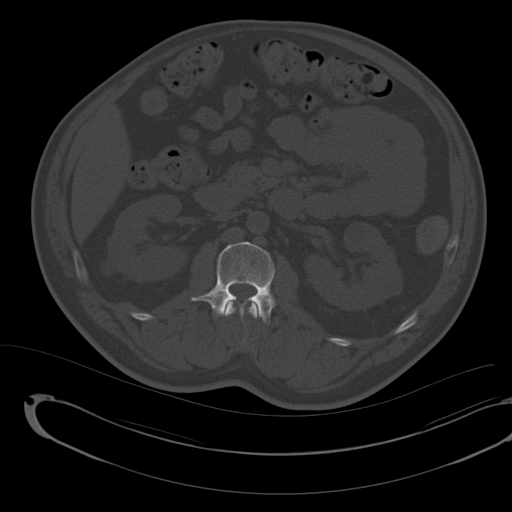
[im 67/95  soft-tissue]
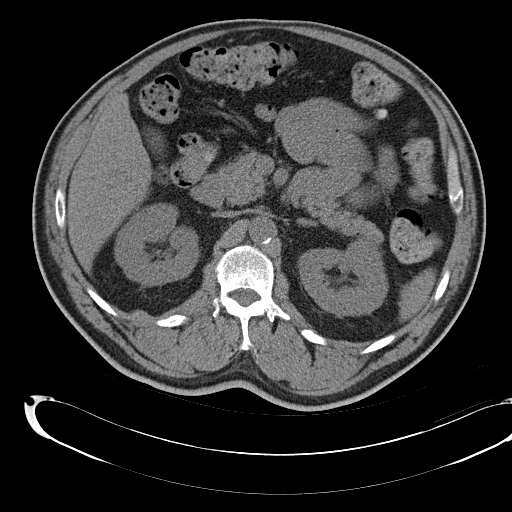
[im 75/95  soft-tissue]
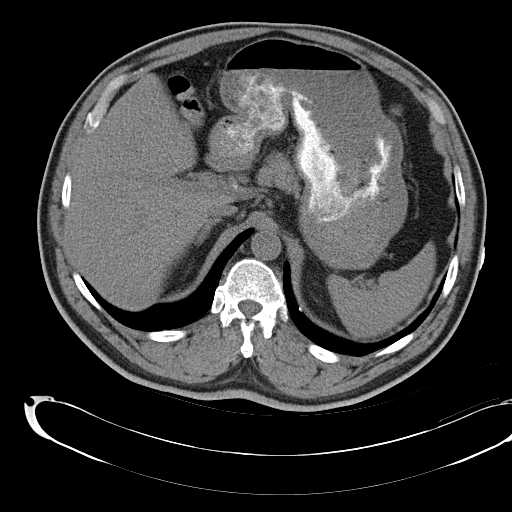
[im 83/95  soft-tissue]
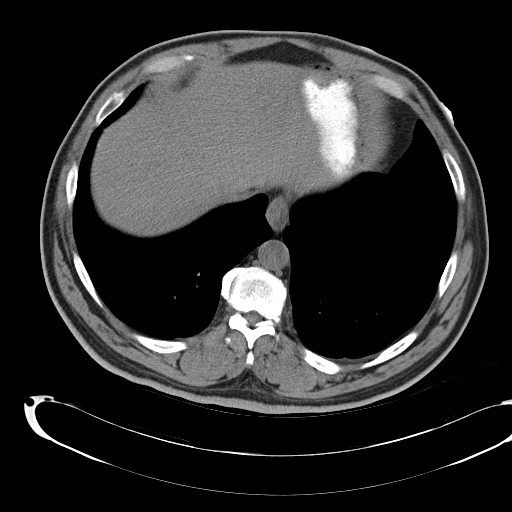
[im 91/95  soft-tissue]
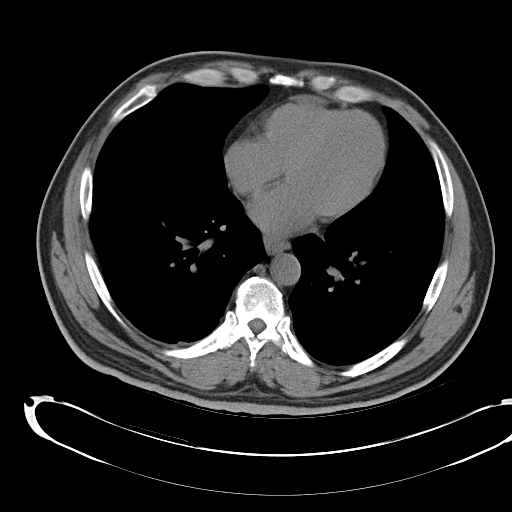

[Series 3: mpr cor 3.0mm · coronal · 0.78mm/px · 3 of 108 slices shown]
[im 36/108  soft-tissue]
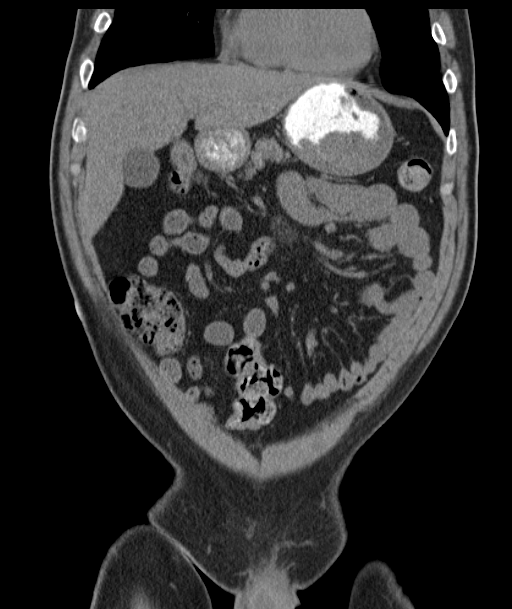
[im 48/108  soft-tissue]
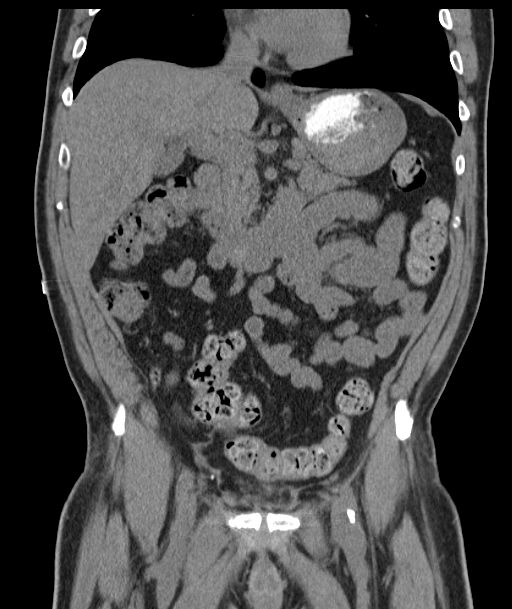
[im 60/108  soft-tissue]
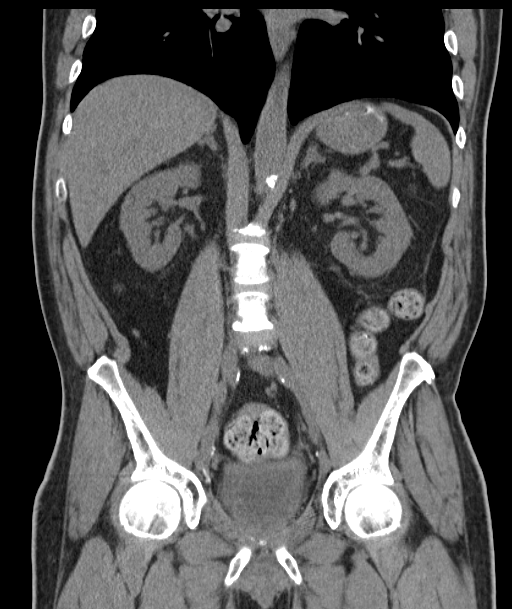

[16 of 46 positions shown; findings below may reference images not displayed]

FINDINGS: Mild multilevel degenerative disc disease is noted in the lumbar
spine. 3 mm nodule is noted in lingular segment of left upper lobe.

No gallstones are noted. No focal abnormalities noted in the liver,
spleen or pancreas. Adrenal glands appear normal. Bilateral renal
cysts are noted. No hydronephrosis or renal obstruction is noted. No
renal or ureteral calculi are noted. Atherosclerosis of abdominal
aorta is noted without aneurysm formation. The appendix appears
normal. There is no evidence of bowel obstruction. Urinary bladder
is decompressed. However, its margins are indistinct suggesting
possible inflammation or cystitis. No abnormal fluid collection is
noted. Status post prostatectomy. No significant adenopathy is
noted.
IMPRESSION: Bilateral simple renal cysts are noted.

No hydronephrosis or renal obstruction is noted. No renal or
ureteral calculi are noted.

Urinary bladder is decompressed, although appears to be inflamed
concerning for cystitis. Clinical correlation is recommended.

3 mm nodule seen in lingular segment of left upper lobe. If the
patient is at high risk for bronchogenic carcinoma, follow-up chest
CT at 1 year is recommended. If the patient is at low risk, no
follow-up is needed. This recommendation follows the consensus
statement: Guidelines for Management of Small Pulmonary Nodules
Detected on CT Scans: A Statement from the [HOSPITAL] as

## 2015-09-26 DIAGNOSIS — E785 Hyperlipidemia, unspecified: Secondary | ICD-10-CM | POA: Diagnosis not present

## 2015-09-26 DIAGNOSIS — E119 Type 2 diabetes mellitus without complications: Secondary | ICD-10-CM | POA: Diagnosis not present

## 2015-10-03 DIAGNOSIS — E1140 Type 2 diabetes mellitus with diabetic neuropathy, unspecified: Secondary | ICD-10-CM | POA: Diagnosis not present

## 2015-10-03 DIAGNOSIS — G5603 Carpal tunnel syndrome, bilateral upper limbs: Secondary | ICD-10-CM | POA: Diagnosis not present

## 2015-10-08 DIAGNOSIS — I6990 Unspecified sequelae of unspecified cerebrovascular disease: Secondary | ICD-10-CM | POA: Diagnosis not present

## 2015-10-08 DIAGNOSIS — E1140 Type 2 diabetes mellitus with diabetic neuropathy, unspecified: Secondary | ICD-10-CM | POA: Diagnosis not present

## 2015-10-08 DIAGNOSIS — I10 Essential (primary) hypertension: Secondary | ICD-10-CM | POA: Diagnosis not present

## 2015-10-08 DIAGNOSIS — R2681 Unsteadiness on feet: Secondary | ICD-10-CM | POA: Diagnosis not present

## 2015-10-08 DIAGNOSIS — G5600 Carpal tunnel syndrome, unspecified upper limb: Secondary | ICD-10-CM | POA: Diagnosis not present

## 2015-12-27 DIAGNOSIS — I10 Essential (primary) hypertension: Secondary | ICD-10-CM | POA: Diagnosis not present

## 2015-12-27 DIAGNOSIS — I6990 Unspecified sequelae of unspecified cerebrovascular disease: Secondary | ICD-10-CM | POA: Diagnosis not present

## 2015-12-27 DIAGNOSIS — E1140 Type 2 diabetes mellitus with diabetic neuropathy, unspecified: Secondary | ICD-10-CM | POA: Diagnosis not present

## 2015-12-27 DIAGNOSIS — M79605 Pain in left leg: Secondary | ICD-10-CM | POA: Diagnosis not present

## 2015-12-27 DIAGNOSIS — G5600 Carpal tunnel syndrome, unspecified upper limb: Secondary | ICD-10-CM | POA: Diagnosis not present

## 2015-12-27 DIAGNOSIS — R2681 Unsteadiness on feet: Secondary | ICD-10-CM | POA: Diagnosis not present

## 2016-01-10 ENCOUNTER — Telehealth: Payer: Self-pay

## 2016-01-10 NOTE — Telephone Encounter (Signed)
LMOM to call.

## 2016-01-10 NOTE — Telephone Encounter (Signed)
Pt's wife called to set up patient's colonoscopy. He had received a letter from DS. She is going to work at [employer] and asked if DS could call before then. [phone]

## 2016-01-16 ENCOUNTER — Telehealth: Payer: Self-pay

## 2016-01-16 NOTE — Telephone Encounter (Signed)
Triaged pt and his wife will call for the appt.

## 2016-01-16 NOTE — Telephone Encounter (Signed)
See separate triage.  

## 2016-01-17 ENCOUNTER — Telehealth: Payer: Self-pay

## 2016-01-17 NOTE — Telephone Encounter (Signed)
Pt does not have all of the medications available and she will call me back on Monday with that list.

## 2016-01-17 NOTE — Telephone Encounter (Signed)
Pt's wife called for DS regarding scheduling patient's colonoscopy. She has to leave for work at 3 and asked for a return call before then. [phone]

## 2016-01-22 NOTE — Telephone Encounter (Signed)
See separate triage.  

## 2016-01-22 NOTE — Telephone Encounter (Addendum)
Gastroenterology Pre-Procedure Review  Request Date: 01/16/2016 Requesting Physician: Tressia Danas, MD    PATIENT REVIEW QUESTIONS: The patient responded to the following health history questions as indicated:    1. Diabetes Melitis: YES 2. Joint replacements in the past 12 months: no 3. Major health problems in the past 3 months: no 4. Has an artificial valve or MVP: no 5. Has a defibrillator: no 6. Has been advised in past to take antibiotics in advance of a procedure like teeth cleaning: no 7. Family history of colon cancer: no  8. Alcohol Use: no 9. History of sleep apnea: no     MEDICATIONS & ALLERGIES:    Patient reports the following regarding taking any blood thinners:   Plavix? no Aspirin? YES Coumadin? no  Patient confirms/reports the following medications:  Current Outpatient Prescriptions  Medication Sig Dispense Refill  . amLODipine (NORVASC) 10 MG tablet Take 10 mg by mouth daily.    Marland Kitchen aspirin EC 81 MG tablet Take 162 mg by mouth daily.    Marland Kitchen atorvastatin (LIPITOR) 80 MG tablet Take 1 tablet (80 mg total) by mouth daily at 6 PM. 30 tablet 1  . glipiZIDE (GLUCOTROL XL) 10 MG 24 hr tablet Take 10 mg by mouth daily with breakfast.    . hydrochlorothiazide (HYDRODIURIL) 25 MG tablet Take 25 mg by mouth daily.    Marland Kitchen HYDROcodone-acetaminophen (NORCO) 5-325 MG per tablet Take 1 tablet by mouth every 4 (four) hours as needed for moderate pain. 15 tablet 0  . insulin detemir (LEVEMIR) 100 UNIT/ML injection Inject 0.13 mLs (13 Units total) into the skin daily. (Patient taking differently: Inject 8 Units into the skin daily. ) 10 mL 11  . metFORMIN (GLUCOPHAGE-XR) 500 MG 24 hr tablet Take 1 tablet (500 mg total) by mouth 2 (two) times daily. (Patient taking differently: Take 1,000 mg by mouth 2 (two) times daily. ) 30 tablet 1  . metoprolol succinate (TOPROL-XL) 50 MG 24 hr tablet Take 1 tablet (50 mg total) by mouth daily. (Patient taking differently: Take 200 mg by mouth  daily. ) 30 tablet 1  . pregabalin (LYRICA) 150 MG capsule Take 1 capsule (150 mg total) by mouth 2 (two) times daily. 20 capsule 0  . ranitidine (ZANTAC) 150 MG capsule Take 1 capsule by mouth 2 (two) times daily as needed for heartburn.   0  . traMADol (ULTRAM) 50 MG tablet Take 1 tablet (50 mg total) by mouth at bedtime. 30 tablet 0   No current facility-administered medications for this visit.     Patient confirms/reports the following allergies:  No Known Allergies  No orders of the defined types were placed in this encounter.   AUTHORIZATION INFORMATION Primary Insurance:   ID #:   Group #:  Pre-Cert / Auth required:  Pre-Cert / Auth #:   Secondary Insurance:   ID #:   Group #:  Pre-Cert / Auth required: Pre-Cert / Auth #:   SCHEDULE INFORMATION: Procedure has been scheduled as follows:  Date:               Time:   Location:   This Gastroenterology Pre-Precedure Review Form is being routed to the following provider(s): R. Garfield Cornea, MD

## 2016-01-22 NOTE — Telephone Encounter (Signed)
Day of prep: glipizide 5mg  am, levemir 4 units daily, metformin 500mg  bid

## 2016-01-23 NOTE — Telephone Encounter (Signed)
Is he still on the hydrocodone and the tramadol?

## 2016-01-23 NOTE — Telephone Encounter (Signed)
LMOM for Jim [name] to call me.

## 2016-01-25 NOTE — Telephone Encounter (Signed)
Pt's wife checked with pt and he is taking Hydrocodone and Tramadol, but the Tramadol just prn.

## 2016-01-25 NOTE — Telephone Encounter (Signed)
We probably should bring him in so we can choose appropriate sedation for him based on polypharmacy.

## 2016-01-30 NOTE — Telephone Encounter (Signed)
Pt has been scheduled an OV appt with Walden Field, NP on 02/20/2016 at 12:30 PM.

## 2016-02-20 ENCOUNTER — Encounter: Payer: Self-pay | Admitting: Nurse Practitioner

## 2016-02-20 ENCOUNTER — Ambulatory Visit (INDEPENDENT_AMBULATORY_CARE_PROVIDER_SITE_OTHER): Payer: Medicare Other | Admitting: Nurse Practitioner

## 2016-02-20 VITALS — BP 148/70 | HR 74 | Temp 98.0°F | Ht 68.5 in | Wt 199.4 lb

## 2016-02-20 DIAGNOSIS — Z1211 Encounter for screening for malignant neoplasm of colon: Secondary | ICD-10-CM

## 2016-02-20 DIAGNOSIS — Z79899 Other long term (current) drug therapy: Secondary | ICD-10-CM

## 2016-02-20 NOTE — Assessment & Plan Note (Signed)
Patient presents for first-ever screening colonoscopy. Has never had a colonoscopy before and is significantly overdue for 65 year old African-American male. He is currently asymptomatic from a GI standpoint. He has been on chronic pain medications for the past 6 months. Has not been on Norco in the past month, is still on tramadol as needed. We will proceed with screening colonoscopy.  Proceed with TCS with Dr. Gala Romney in near future: the risks, benefits, and alternatives have been discussed with the patient in detail. The patient states understanding and desires to proceed.  The patient is not on any anticoagulants, anxiolytics. He is on Neurontin, tramadol, was previously on Norco. We will provide 12.5 mg preprocedure Phenergan to promote adequate sedation.

## 2016-02-20 NOTE — Patient Instructions (Signed)
1. We will schedule your procedure for you. 2. Further recommendations to be based on the results of your procedure. 3. Return for follow-up based on recommendations made after your procedure. 

## 2016-02-20 NOTE — Progress Notes (Signed)
Primary Care Physician:  Petra Kuba, MD Primary Gastroenterologist:  Dr. Gala Romney  Chief Complaint  Patient presents with  . Colonoscopy    no problems, never had TCS    HPI:   Jim [name] is a 65 y.o. male who presents to schedule first-ever screening colonoscopy. Phone triage was aborted and the patient was brought in for an office visit due to pain medication use for possible augmented sedation. PCP notes reviewed, the patient is never had a colonoscopy before and is significantly overdue at his current age of 69.  Today he states he's doing well overall. Denies abdominal pain, hematochezia, melena, N/V, fever, chills, unintentional weight loss. Daily bowel movement consistent with [place] 4, no straining. Denies chest pain, dyspnea, dizziness, lightheadedness, syncope, near syncope. Denies any other upper or lower GI symptoms.   Past Medical History:  Diagnosis Date  . Chronic kidney disease    cyst on left kidney   . Degenerative disc disease, lumbar   . Diabetes mellitus   . GERD (gastroesophageal reflux disease)   . Hx of radiation therapy 12/13/12- 01/27/13   prostate bed 6600 cGy 33 sessions  . Hypertension   . Prostate cancer ([place]) 06/11/12   gleason 7  . Stroke [facility])     Past Surgical History:  Procedure Laterality Date  . LYMPHADENECTOMY  06/11/2012   Procedure: LYMPHADENECTOMY;  Surgeon: Alexis Frock, MD;  Location: [facility];  Service: Urology;  Laterality: Bilateral;  . PROSTATE BIOPSY  03/25/2012  . ROBOT ASSISTED LAPAROSCOPIC RADICAL PROSTATECTOMY  06/11/2012   Procedure: ROBOTIC ASSISTED LAPAROSCOPIC RADICAL PROSTATECTOMY;  Surgeon: Alexis Frock, MD;  Location: [facility];  Service: Urology;  Laterality: N/A;  1st procedure: Robotic Left Renal Cyst Decortication   . ROBOTIC ASSITED PARTIAL NEPHRECTOMY  06/11/2012   Procedure: ROBOTIC ASSITED PARTIAL NEPHRECTOMY;  Surgeon: Alexis Frock, MD;  Location: [facility];  Service: Urology;  Laterality: Left;     Current Outpatient Prescriptions  Medication Sig Dispense Refill  . amLODipine (NORVASC) 10 MG tablet Take 10 mg by mouth daily.    Marland Kitchen aspirin EC 81 MG tablet Take 162 mg by mouth daily.    Marland Kitchen atorvastatin (LIPITOR) 80 MG tablet Take 1 tablet (80 mg total) by mouth daily at 6 PM. 30 tablet 1  . diclofenac sodium (VOLTAREN) 1 % GEL Apply 2 g topically every 6 (six) hours as needed.    . docusate sodium (COLACE) 100 MG capsule Take 100 mg by mouth 2 (two) times daily.    Marland Kitchen gabapentin (NEURONTIN) 100 MG capsule Take 100 mg by mouth 3 (three) times daily.    Marland Kitchen glipiZIDE (GLUCOTROL XL) 10 MG 24 hr tablet Take 10 mg by mouth daily with breakfast.    . hydrochlorothiazide (HYDRODIURIL) 25 MG tablet Take 25 mg by mouth daily.    . insulin detemir (LEVEMIR) 100 UNIT/ML injection Inject 0.13 mLs (13 Units total) into the skin daily. (Patient taking differently: Inject 8 Units into the skin daily. ) 10 mL 11  . metFORMIN (GLUCOPHAGE-XR) 500 MG 24 hr tablet Take 1 tablet (500 mg total) by mouth 2 (two) times daily. (Patient taking differently: Take 1,000 mg by mouth 2 (two) times daily. ) 30 tablet 1  . metoprolol succinate (TOPROL-XL) 50 MG 24 hr tablet Take 1 tablet (50 mg total) by mouth daily. (Patient taking differently: Take 200 mg by mouth daily. ) 30 tablet 1  . pyridoxine (B-6) 100 MG tablet Take 100 mg by mouth 2 (two) times daily.    Marland Kitchen  ranitidine (ZANTAC) 150 MG capsule Take 1 capsule by mouth 2 (two) times daily as needed for heartburn.   0  . traMADol (ULTRAM) 50 MG tablet Take 1 tablet (50 mg total) by mouth at bedtime. 30 tablet 0   No current facility-administered medications for this visit.     Allergies as of 02/20/2016  . (No Known Allergies)    Family History  Problem Relation Age of Onset  . Cancer Mother     pancreatic  . Cancer Father     lung  . Cancer Brother     prostate-surgery 6 years ago  . Colon cancer Neg Hx     Social History   Social History  . Marital  status: Married    Spouse name: N/A  . Number of children: N/A  . Years of education: N/A   Occupational History  . Not on file.   Social History Main Topics  . Smoking status: Never Smoker  . Smokeless tobacco: Never Used  . Alcohol use No     Comment: no use in 1 year per patient   . Drug use: No  . Sexual activity: Not on file   Other Topics Concern  . Not on file   Social History Narrative  . No narrative on file    Review of Systems: General: Negative for anorexia, weight loss, fever, chills, fatigue, weakness. ENT: Negative for hoarseness, difficulty swallowing. CV: Negative for chest pain, angina, palpitations, peripheral edema.  Respiratory: Negative for dyspnea at rest, cough, sputum, wheezing.  GI: See history of present illness. MS: One-sided weakness related to CVA stroke..  Derm: Negative for rash or itching.  Neuro: Positive DM neuropathy, one-sided weakness.  Endo: Negative for unusual weight change.  Heme: Negative for bruising or bleeding. Allergy: Negative for rash or hives.    Physical Exam: BP (!) 148/70   Pulse 74   Temp 98 F (36.7 C) (Oral)   Ht 5' 8.5" (1.74 m)   Wt 199 lb 6.4 oz (90.4 kg)   BMI 29.88 kg/m  General:   Alert and oriented. Pleasant and cooperative. Well-nourished and well-developed.  Head:  Normocephalic and atraumatic. Eyes:  Without icterus, sclera clear and conjunctiva pink.  Ears:  Normal auditory acuity. Cardiovascular:  S1, S2 present without murmurs appreciated. Extremities without clubbing or edema. Respiratory:  Clear to auscultation bilaterally. No wheezes, rales, or rhonchi. No distress.  Gastrointestinal:  +BS, rounded but soft, non-tender and non-distended. No HSM noted. No guarding or rebound. Rectal:  Deferred  Musculoskalatal:  Symmetrical without gross deformities. Hobbling gait de to CVA residual weakness, uses can to ambulate. Skin:  Intact without significant lesions or rashes. Neurologic:  Alert and  oriented x4 Psych:  Alert and cooperative. Normal mood and affect. Heme/Lymph/Immune: No excessive bruising noted.    02/20/2016 1:14 PM   Disclaimer: This note was dictated with voice recognition software. Similar sounding words can inadvertently be transcribed and may not be corrected upon review.

## 2016-02-21 ENCOUNTER — Telehealth: Payer: Self-pay

## 2016-02-21 ENCOUNTER — Other Ambulatory Visit: Payer: Self-pay

## 2016-02-21 DIAGNOSIS — Z1211 Encounter for screening for malignant neoplasm of colon: Secondary | ICD-10-CM

## 2016-02-21 MED ORDER — PEG 3350-KCL-NA BICARB-NACL 420 G PO SOLR: 4000.0000 mL | ORAL | 0 refills | Status: AC

## 2016-02-21 NOTE — Telephone Encounter (Signed)
Called pt this morning and scheduled TCS for 03/07/16 at 7:30am. Instructions mailed.

## 2016-02-25 NOTE — Progress Notes (Signed)
CC'D TO PCP °

## 2016-03-07 ENCOUNTER — Ambulatory Visit (HOSPITAL_COMMUNITY)
Admission: RE | Admit: 2016-03-07 | Discharge: 2016-03-07 | Disposition: A | Discharge status: Home / Self Care | Payer: BLUE CROSS/BLUE SHIELD | Source: Ambulatory Visit | Attending: Internal Medicine | Admitting: Internal Medicine

## 2016-03-07 ENCOUNTER — Encounter (HOSPITAL_COMMUNITY): Payer: Self-pay | Admitting: *Deleted

## 2016-03-07 ENCOUNTER — Encounter (HOSPITAL_COMMUNITY)
Admission: RE | Disposition: A | Discharge status: Home / Self Care | Payer: Self-pay | Source: Ambulatory Visit | Attending: Internal Medicine

## 2016-03-07 DIAGNOSIS — K219 Gastro-esophageal reflux disease without esophagitis: Secondary | ICD-10-CM | POA: Diagnosis not present

## 2016-03-07 DIAGNOSIS — K635 Polyp of colon: Secondary | ICD-10-CM

## 2016-03-07 DIAGNOSIS — Z7982 Long term (current) use of aspirin: Secondary | ICD-10-CM | POA: Insufficient documentation

## 2016-03-07 DIAGNOSIS — Z1212 Encounter for screening for malignant neoplasm of rectum: Secondary | ICD-10-CM | POA: Diagnosis not present

## 2016-03-07 DIAGNOSIS — Z1211 Encounter for screening for malignant neoplasm of colon: Secondary | ICD-10-CM | POA: Diagnosis not present

## 2016-03-07 DIAGNOSIS — D120 Benign neoplasm of cecum: Secondary | ICD-10-CM | POA: Diagnosis not present

## 2016-03-07 DIAGNOSIS — K5730 Diverticulosis of large intestine without perforation or abscess without bleeding: Secondary | ICD-10-CM | POA: Diagnosis not present

## 2016-03-07 DIAGNOSIS — E119 Type 2 diabetes mellitus without complications: Secondary | ICD-10-CM | POA: Diagnosis not present

## 2016-03-07 DIAGNOSIS — Z8673 Personal history of transient ischemic attack (TIA), and cerebral infarction without residual deficits: Secondary | ICD-10-CM | POA: Insufficient documentation

## 2016-03-07 DIAGNOSIS — Z8546 Personal history of malignant neoplasm of prostate: Secondary | ICD-10-CM | POA: Diagnosis not present

## 2016-03-07 DIAGNOSIS — Z905 Acquired absence of kidney: Secondary | ICD-10-CM | POA: Diagnosis not present

## 2016-03-07 DIAGNOSIS — Z79899 Other long term (current) drug therapy: Secondary | ICD-10-CM | POA: Insufficient documentation

## 2016-03-07 DIAGNOSIS — Z794 Long term (current) use of insulin: Secondary | ICD-10-CM | POA: Diagnosis not present

## 2016-03-07 DIAGNOSIS — I10 Essential (primary) hypertension: Secondary | ICD-10-CM | POA: Insufficient documentation

## 2016-03-07 DIAGNOSIS — Z923 Personal history of irradiation: Secondary | ICD-10-CM | POA: Insufficient documentation

## 2016-03-07 HISTORY — PX: COLONOSCOPY: SHX5424

## 2016-03-07 LAB — GLUCOSE, CAPILLARY
Glucose-Capillary: 53 mg/dL — ABNORMAL LOW (ref 65–99)
Glucose-Capillary: 126 mg/dL — ABNORMAL HIGH (ref 65–99)

## 2016-03-07 SURGERY — COLONOSCOPY
Anesthesia: Moderate Sedation

## 2016-03-07 MED ORDER — MEPERIDINE HCL 100 MG/ML IJ SOLN
INTRAMUSCULAR | Status: DC | PRN
  Administered 2016-03-07: 50 mg
  Administered 2016-03-07: 25 mg

## 2016-03-07 MED ORDER — MIDAZOLAM HCL 5 MG/5ML IJ SOLN
INTRAMUSCULAR | Status: DC | PRN
  Administered 2016-03-07: 1 mg via INTRAVENOUS
  Administered 2016-03-07: 2 mg via INTRAVENOUS

## 2016-03-07 MED ORDER — PROMETHAZINE HCL 25 MG/ML IJ SOLN
INTRAMUSCULAR | Status: AC
  Administered 2016-03-07: 12.5 mg
  Filled 2016-03-07: qty 1

## 2016-03-07 MED ORDER — DEXTROSE-NACL 5-0.9 % IV SOLN
INTRAVENOUS | Status: DC
  Administered 2016-03-07: 12.5 mL via INTRAVENOUS

## 2016-03-07 MED ORDER — ONDANSETRON HCL 4 MG/2ML IJ SOLN
INTRAMUSCULAR | Status: DC | PRN
  Administered 2016-03-07: 4 mg via INTRAVENOUS

## 2016-03-07 MED ORDER — PROMETHAZINE HCL 25 MG/ML IJ SOLN
12.5000 mg | Freq: Once | INTRAMUSCULAR | Status: AC
Start: 2016-03-07 — End: 2016-03-07
  Administered 2016-03-07: 12.5 mg via INTRAVENOUS

## 2016-03-07 MED ORDER — SODIUM CHLORIDE 0.9 % IV SOLN: INTRAVENOUS | Status: DC

## 2016-03-07 MED ORDER — MEPERIDINE HCL 100 MG/ML IJ SOLN
INTRAMUSCULAR | Status: AC
  Filled 2016-03-07: qty 2

## 2016-03-07 MED ORDER — STERILE WATER FOR IRRIGATION IR SOLN
Status: DC | PRN
  Administered 2016-03-07: 2.5 mL

## 2016-03-07 MED ORDER — SODIUM CHLORIDE 0.9% FLUSH
INTRAVENOUS | Status: AC
  Filled 2016-03-07: qty 10

## 2016-03-07 MED ORDER — SODIUM CHLORIDE BACTERIOSTATIC 0.9 % IJ SOLN
INTRAMUSCULAR | Status: AC
  Filled 2016-03-07: qty 10

## 2016-03-07 MED ORDER — MIDAZOLAM HCL 5 MG/5ML IJ SOLN
INTRAMUSCULAR | Status: AC
  Filled 2016-03-07: qty 10

## 2016-03-07 MED ORDER — ONDANSETRON HCL 4 MG/2ML IJ SOLN
INTRAMUSCULAR | Status: AC
  Filled 2016-03-07: qty 2

## 2016-03-07 MED ORDER — PROMETHAZINE HCL 25 MG/ML IJ SOLN
INTRAMUSCULAR | Status: AC
  Filled 2016-03-07: qty 1

## 2016-03-07 NOTE — H&P (View-Only) (Signed)
Primary Care Physician:  Petra Kuba, MD Primary Gastroenterologist:  Dr. Gala Romney  Chief Complaint  Patient presents with  . Colonoscopy    no problems, never had TCS    HPI:   Jim [name] is a 65 y.o. male who presents to schedule first-ever screening colonoscopy. Phone triage was aborted and the patient was brought in for an office visit due to pain medication use for possible augmented sedation. PCP notes reviewed, the patient is never had a colonoscopy before and is significantly overdue at his current age of 46.  Today he states he's doing well overall. Denies abdominal pain, hematochezia, melena, N/V, fever, chills, unintentional weight loss. Daily bowel movement consistent with [place] 4, no straining. Denies chest pain, dyspnea, dizziness, lightheadedness, syncope, near syncope. Denies any other upper or lower GI symptoms.   Past Medical History:  Diagnosis Date  . Chronic kidney disease    cyst on left kidney   . Degenerative disc disease, lumbar   . Diabetes mellitus   . GERD (gastroesophageal reflux disease)   . Hx of radiation therapy 12/13/12- 01/27/13   prostate bed 6600 cGy 33 sessions  . Hypertension   . Prostate cancer ([place]) 06/11/12   gleason 7  . Stroke [facility])     Past Surgical History:  Procedure Laterality Date  . LYMPHADENECTOMY  06/11/2012   Procedure: LYMPHADENECTOMY;  Surgeon: Alexis Frock, MD;  [facility];  Service: Urology;  Laterality: Bilateral;  . PROSTATE BIOPSY  03/25/2012  . ROBOT ASSISTED LAPAROSCOPIC RADICAL PROSTATECTOMY  06/11/2012   Procedure: ROBOTIC ASSISTED LAPAROSCOPIC RADICAL PROSTATECTOMY;  Surgeon: Alexis Frock, MD;  [facility];  Service: Urology;  Laterality: N/A;  1st procedure: Robotic Left Renal Cyst Decortication   . ROBOTIC ASSITED PARTIAL NEPHRECTOMY  06/11/2012   Procedure: ROBOTIC ASSITED PARTIAL NEPHRECTOMY;  Surgeon: Alexis Frock, MD;  [facility];  Service: Urology;  Laterality: Left;     Current Outpatient Prescriptions  Medication Sig Dispense Refill  . amLODipine (NORVASC) 10 MG tablet Take 10 mg by mouth daily.    Marland Kitchen aspirin EC 81 MG tablet Take 162 mg by mouth daily.    Marland Kitchen atorvastatin (LIPITOR) 80 MG tablet Take 1 tablet (80 mg total) by mouth daily at 6 PM. 30 tablet 1  . diclofenac sodium (VOLTAREN) 1 % GEL Apply 2 g topically every 6 (six) hours as needed.    . docusate sodium (COLACE) 100 MG capsule Take 100 mg by mouth 2 (two) times daily.    Marland Kitchen gabapentin (NEURONTIN) 100 MG capsule Take 100 mg by mouth 3 (three) times daily.    Marland Kitchen glipiZIDE (GLUCOTROL XL) 10 MG 24 hr tablet Take 10 mg by mouth daily with breakfast.    . hydrochlorothiazide (HYDRODIURIL) 25 MG tablet Take 25 mg by mouth daily.    . insulin detemir (LEVEMIR) 100 UNIT/ML injection Inject 0.13 mLs (13 Units total) into the skin daily. (Patient taking differently: Inject 8 Units into the skin daily. ) 10 mL 11  . metFORMIN (GLUCOPHAGE-XR) 500 MG 24 hr tablet Take 1 tablet (500 mg total) by mouth 2 (two) times daily. (Patient taking differently: Take 1,000 mg by mouth 2 (two) times daily. ) 30 tablet 1  . metoprolol succinate (TOPROL-XL) 50 MG 24 hr tablet Take 1 tablet (50 mg total) by mouth daily. (Patient taking differently: Take 200 mg by mouth daily. ) 30 tablet 1  . pyridoxine (B-6) 100 MG tablet Take 100 mg by mouth 2 (two) times daily.    Marland Kitchen  ranitidine (ZANTAC) 150 MG capsule Take 1 capsule by mouth 2 (two) times daily as needed for heartburn.   0  . traMADol (ULTRAM) 50 MG tablet Take 1 tablet (50 mg total) by mouth at bedtime. 30 tablet 0   No current facility-administered medications for this visit.     Allergies as of 02/20/2016  . (No Known Allergies)    Family History  Problem Relation Age of Onset  . Cancer Mother     pancreatic  . Cancer Father     lung  . Cancer Brother     prostate-surgery 6 years ago  . Colon cancer Neg Hx     Social History   Social History  . Marital  status: Married    Spouse name: N/A  . Number of children: N/A  . Years of education: N/A   Occupational History  . Not on file.   Social History Main Topics  . Smoking status: Never Smoker  . Smokeless tobacco: Never Used  . Alcohol use No     Comment: no use in 1 year per patient   . Drug use: No  . Sexual activity: Not on file   Other Topics Concern  . Not on file   Social History Narrative  . No narrative on file    Review of Systems: General: Negative for anorexia, weight loss, fever, chills, fatigue, weakness. ENT: Negative for hoarseness, difficulty swallowing. CV: Negative for chest pain, angina, palpitations, peripheral edema.  Respiratory: Negative for dyspnea at rest, cough, sputum, wheezing.  GI: See history of present illness. MS: One-sided weakness related to CVA stroke..  Derm: Negative for rash or itching.  Neuro: Positive DM neuropathy, one-sided weakness.  Endo: Negative for unusual weight change.  Heme: Negative for bruising or bleeding. Allergy: Negative for rash or hives.    Physical Exam: BP (!) 148/70   Pulse 74   Temp 98 F (36.7 C) (Oral)   Ht 5' 8.5" (1.74 m)   Wt 199 lb 6.4 oz (90.4 kg)   BMI 29.88 kg/m  General:   Alert and oriented. Pleasant and cooperative. Well-nourished and well-developed.  Head:  Normocephalic and atraumatic. Eyes:  Without icterus, sclera clear and conjunctiva pink.  Ears:  Normal auditory acuity. Cardiovascular:  S1, S2 present without murmurs appreciated. Extremities without clubbing or edema. Respiratory:  Clear to auscultation bilaterally. No wheezes, rales, or rhonchi. No distress.  Gastrointestinal:  +BS, rounded but soft, non-tender and non-distended. No HSM noted. No guarding or rebound. Rectal:  Deferred  Musculoskalatal:  Symmetrical without gross deformities. Hobbling gait de to CVA residual weakness, uses can to ambulate. Skin:  Intact without significant lesions or rashes. Neurologic:  Alert and  oriented x4 Psych:  Alert and cooperative. Normal mood and affect. Heme/Lymph/Immune: No excessive bruising noted.    02/20/2016 1:14 PM   Disclaimer: This note was dictated with voice recognition software. Similar sounding words can inadvertently be transcribed and may not be corrected upon review.

## 2016-03-07 NOTE — Discharge Instructions (Addendum)
Colon Polyps Polyps are lumps of extra tissue growing inside the body. Polyps can grow in the large intestine (colon). Most colon polyps are noncancerous (benign). However, some colon polyps can become cancerous over time. Polyps that are larger than a pea may be harmful. To be safe, caregivers remove and test all polyps. CAUSES  Polyps form when mutations in the genes cause your cells to grow and divide even though no more tissue is needed. RISK FACTORS There are a number of risk factors that can increase your chances of getting colon polyps. They include:  Being older than 50 years.  Family history of colon polyps or colon cancer.  Long-term colon diseases, such as colitis or Crohn disease.  Being overweight.  Smoking.  Being inactive.  Drinking too much alcohol. SYMPTOMS  Most small polyps do not cause symptoms. If symptoms are present, they may include:  Blood in the stool. The stool may look dark red or black.  Constipation or diarrhea that lasts longer than 1 week. DIAGNOSIS People often do not know they have polyps until their caregiver finds them during a regular checkup. Your caregiver can use 4 tests to check for polyps:  Digital rectal exam. The caregiver wears gloves and feels inside the rectum. This test would find polyps only in the rectum.  Barium enema. The caregiver puts a liquid called barium into your rectum before taking X-rays of your colon. Barium makes your colon look white. Polyps are dark, so they are easy to see in the X-ray pictures.  Sigmoidoscopy. A thin, flexible tube (sigmoidoscope) is placed into your rectum. The sigmoidoscope has a light and tiny camera in it. The caregiver uses the sigmoidoscope to look at the last third of your colon.  Colonoscopy. This test is like sigmoidoscopy, but the caregiver looks at the entire colon. This is the most common method for finding and removing polyps. TREATMENT  Any polyps will be removed during a  sigmoidoscopy or colonoscopy. The polyps are then tested for cancer. PREVENTION  To help lower your risk of getting more colon polyps:  Eat plenty of fruits and vegetables. Avoid eating fatty foods.  Do not smoke.  Avoid drinking alcohol.  Exercise every day.  Lose weight if recommended by your caregiver.  Eat plenty of calcium and folate. Foods that are rich in calcium include milk, cheese, and broccoli. Foods that are rich in folate include chickpeas, kidney beans, and spinach. HOME CARE INSTRUCTIONS Keep all follow-up appointments as directed by your caregiver. You may need periodic exams to check for polyps. SEEK MEDICAL CARE IF: You notice bleeding during a bowel movement.   This information is not intended to replace advice given to you by your health care provider. Make sure you discuss any questions you have with your health care provider.   Document Released: 02/06/2004 Document Revised: 06/02/2014 Document Reviewed: 07/22/2011 Elsevier Interactive Patient Education 2016 [employer]  Colonoscopy Discharge Instructions  Read the instructions outlined below and refer to this sheet in the next few weeks. These discharge instructions provide you with general information on caring for yourself after you leave the hospital. Your doctor may also give you specific instructions. While your treatment has been planned according to the most current medical practices available, unavoidable complications occasionally occur. If you have any problems or questions after discharge, call Dr. Gala Romney at [phone]. ACTIVITY  You may resume your regular activity, but move at a slower pace for the next 24 hours.   Take frequent rest periods  for the next 24 hours.   Walking will help get rid of the air and reduce the bloated feeling in your belly (abdomen).   No driving for 24 hours (because of the medicine (anesthesia) used during the test).    Do not sign any important legal documents or  operate any machinery for 24 hours (because of the anesthesia used during the test).  NUTRITION  Drink plenty of fluids.   You may resume your normal diet as instructed by your doctor.   Begin with a light meal and progress to your normal diet. Heavy or fried foods are harder to digest and may make you feel sick to your stomach (nauseated).   Avoid alcoholic beverages for 24 hours or as instructed.  MEDICATIONS  You may resume your normal medications unless your doctor tells you otherwise.  WHAT YOU CAN EXPECT TODAY  Some feelings of bloating in the abdomen.   Passage of more gas than usual.   Spotting of blood in your stool or on the toilet paper.  IF YOU HAD POLYPS REMOVED DURING THE COLONOSCOPY:  No aspirin products for 7 days or as instructed.   No alcohol for 7 days or as instructed.   Eat a soft diet for the next 24 hours.  FINDING OUT THE RESULTS OF YOUR TEST Not all test results are available during your visit. If your test results are not back during the visit, make an appointment with your caregiver to find out the results. Do not assume everything is normal if you have not heard from your caregiver or the medical facility. It is important for you to follow up on all of your test results.  SEEK IMMEDIATE MEDICAL ATTENTION IF:  You have more than a spotting of blood in your stool.   Your belly is swollen (abdominal distention).   You are nauseated or vomiting.   You have a temperature over 101.   You have abdominal pain or discomfort that is severe or gets worse throughout the day.     Colon polyp and diverticulosis information provided  Further recommendations to follow pending review of pathology report

## 2016-03-07 NOTE — Op Note (Signed)
[facility] Patient Name: Jim [name] Procedure Date: 03/07/2016 7:34 AM MRN: [id number] Date of Birth: 06-Jun-1950 Attending MD: Norvel Richards , MD CSN: [id number] Age: 65 Admit Type: Outpatient Procedure:                Colonoscopy with snare polypectomy Indications:              Screening for colorectal malignant neoplasm Providers:                Norvel Richards, MD, Lurline Del, RN, Charlyne Petrin, RN, Purcell Nails. [place], [employer] Referring MD:             Sena Hitch, MD Medicines:                Midazolam 3 mg IV, Meperidine 75 mg IV,                            Promethazine 12.5 mg IV, Ondansetron 4 mg IV Complications:            No immediate complications. Estimated Blood Loss:     Estimated blood loss was minimal. Procedure:                Pre-Anesthesia Assessment:                           - Prior to the procedure, a History and Physical                            was performed, and patient medications and                            allergies were reviewed. The patient's tolerance of                            previous anesthesia was also reviewed. The risks                            and benefits of the procedure and the sedation                            options and risks were discussed with the patient.                            All questions were answered, and informed consent                            was obtained. Prior Anticoagulants: The patient has                            taken no previous anticoagulant or antiplatelet                            agents. ASA Grade Assessment: II - A patient with  mild systemic disease. After reviewing the risks                            and benefits, the patient was deemed in                            satisfactory condition to undergo the procedure.                           After obtaining informed consent, the colonoscope       was passed under direct vision. Throughout the                            procedure, the patient's blood pressure, pulse, and                            oxygen saturations were monitored continuously. The                            [id number]) was introduced through the anus                            and advanced to the the cecum, identified by                            appendiceal orifice and ileocecal valve. The                            colonoscopy was performed without difficulty. The                            patient tolerated the procedure well. The quality                            of the bowel preparation was adequate. The                            ileocecal valve, appendiceal orifice, and rectum                            were photographed. Scope In: 7:41:00 AM Scope Out: 8:00:14 AM Scope Withdrawal Time: 0 hours 11 minutes 38 seconds  Total Procedure Duration: 0 hours 19 minutes 14 seconds  Findings:      The perianal and digital rectal examinations were normal.      Scattered small and large-mouthed diverticula were found in the sigmoid       colon and descending colon.      A 4 mm polyp was found in the cecum. The polyp was sessile. The polyp       was removed with a cold snare. Resection and retrieval were complete.       Estimated blood loss was minimal.      The exam was otherwise without abnormality on direct and retroflexion       views. Impression:               -  Diverticulosis in the sigmoid colon and in the                            descending colon.                           - One 4 mm polyp in the cecum, removed with a cold                            snare. Resected and retrieved.                           - The examination was otherwise normal on direct                            and retroflexion views. Moderate Sedation:      Moderate (conscious) sedation was administered by the endoscopy nurse       and supervised by the endoscopist. The  following parameters were       monitored: oxygen saturation, heart rate, blood pressure, respiratory       rate, EKG, adequacy of pulmonary ventilation, and response to care.       Total physician intraservice time was 24 minutes. Recommendation:           - Patient has a contact number available for                            emergencies. The signs and symptoms of potential                            delayed complications were discussed with the                            patient. Return to normal activities tomorrow.                            Written discharge instructions were provided to the                            patient.                           - Resume previous diet.                           - Continue present medications.                           - Repeat colonoscopy date to be determined after                            pending pathology results are reviewed for                            surveillance based on pathology results.                           -  Return to GI clinic (date not yet determined). Procedure Code(s):        --- Professional ---                           [phone], Colonoscopy, flexible; with removal of                            tumor(s), polyp(s), or other lesion(s) by snare                            technique                           99152, Moderate sedation services provided by the                            same physician or other qualified health care                            professional performing the diagnostic or                            therapeutic service that the sedation supports,                            requiring the presence of an independent trained                            observer to assist in the monitoring of the                            patient's level of consciousness and physiological                            status; initial 15 minutes of intraservice time,                            patient age 76 years or older                            [phone], Moderate sedation services; each additional                            15 minutes intraservice time Diagnosis Code(s):        --- Professional ---                           Z12.11, Encounter for screening for malignant                            neoplasm of colon                           D12.0, Benign neoplasm of cecum  K57.30, Diverticulosis of large intestine without                            perforation or abscess without bleeding CPT copyright 2016 [employer]. All rights reserved. The codes documented in this report are preliminary and upon coder review may  be revised to meet current compliance requirements. Cristopher Estimable. Afia Messenger, MD Norvel Richards, MD 03/07/2016 8:08:16 AM This report has been signed electronically. Number of Addenda: 0

## 2016-03-07 NOTE — Interval H&P Note (Signed)
History and Physical Interval Note:  03/07/2016 7:31 AM  Jim [name]  has presented today for surgery, with the diagnosis of screening  The various methods of treatment have been discussed with the patient and family. After consideration of risks, benefits and other options for treatment, the patient has consented to  Procedure(s) with comments: COLONOSCOPY (N/A) - 7:30 am as a surgical intervention .  The patient's history has been reviewed, patient examined, no change in status, stable for surgery.  I have reviewed the patient's chart and labs.  Questions were answered to the patient's satisfaction.     Jim [name]  No change. First ever screening colonoscopy per plan.  The risks, benefits, limitations, alternatives and imponderables have been reviewed with the patient. Questions have been answered. All parties are agreeable.

## 2016-03-11 ENCOUNTER — Encounter: Payer: Self-pay | Admitting: Internal Medicine

## 2016-03-14 ENCOUNTER — Encounter (HOSPITAL_COMMUNITY): Payer: Self-pay | Admitting: Internal Medicine

## 2016-05-05 ENCOUNTER — Other Ambulatory Visit: Payer: Self-pay | Admitting: Family Medicine

## 2016-05-05 DIAGNOSIS — I639 Cerebral infarction, unspecified: Secondary | ICD-10-CM

## 2016-05-05 DIAGNOSIS — I10 Essential (primary) hypertension: Secondary | ICD-10-CM

## 2016-05-05 DIAGNOSIS — M79651 Pain in right thigh: Secondary | ICD-10-CM

## 2016-05-08 ENCOUNTER — Ambulatory Visit (HOSPITAL_COMMUNITY)
Admission: RE | Admit: 2016-05-08 | Discharge: 2016-05-08 | Disposition: A | Discharge status: Home / Self Care | Payer: BLUE CROSS/BLUE SHIELD | Source: Ambulatory Visit | Attending: Family Medicine | Admitting: Family Medicine

## 2016-05-08 DIAGNOSIS — I639 Cerebral infarction, unspecified: Secondary | ICD-10-CM | POA: Diagnosis not present

## 2016-05-08 DIAGNOSIS — M79651 Pain in right thigh: Secondary | ICD-10-CM

## 2016-05-08 DIAGNOSIS — I10 Essential (primary) hypertension: Secondary | ICD-10-CM

## 2016-05-08 DIAGNOSIS — R600 Localized edema: Secondary | ICD-10-CM | POA: Diagnosis not present

## 2016-06-04 DIAGNOSIS — N183 Chronic kidney disease, stage 3 (moderate): Secondary | ICD-10-CM | POA: Diagnosis not present

## 2016-06-04 DIAGNOSIS — E119 Type 2 diabetes mellitus without complications: Secondary | ICD-10-CM | POA: Diagnosis not present

## 2016-10-16 DIAGNOSIS — N393 Stress incontinence (female) (male): Secondary | ICD-10-CM | POA: Diagnosis not present

## 2016-10-16 DIAGNOSIS — C61 Malignant neoplasm of prostate: Secondary | ICD-10-CM | POA: Diagnosis not present

## 2016-10-16 DIAGNOSIS — N5201 Erectile dysfunction due to arterial insufficiency: Secondary | ICD-10-CM | POA: Diagnosis not present

## 2016-10-16 DIAGNOSIS — R310 Gross hematuria: Secondary | ICD-10-CM | POA: Diagnosis not present

## 2017-04-28 DIAGNOSIS — E119 Type 2 diabetes mellitus without complications: Secondary | ICD-10-CM | POA: Diagnosis not present

## 2019-09-02 ENCOUNTER — Other Ambulatory Visit: Payer: Self-pay

## 2019-09-02 ENCOUNTER — Emergency Department (HOSPITAL_COMMUNITY): Payer: Medicare Other

## 2019-09-02 ENCOUNTER — Emergency Department (HOSPITAL_COMMUNITY)
Admission: EM | Admit: 2019-09-02 | Discharge: 2019-09-02 | Disposition: A | Discharge status: Home / Self Care | Payer: Medicare Other | Attending: Emergency Medicine | Admitting: Emergency Medicine

## 2019-09-02 ENCOUNTER — Encounter (HOSPITAL_COMMUNITY): Payer: Self-pay

## 2019-09-02 DIAGNOSIS — Z794 Long term (current) use of insulin: Secondary | ICD-10-CM | POA: Insufficient documentation

## 2019-09-02 DIAGNOSIS — Z7982 Long term (current) use of aspirin: Secondary | ICD-10-CM | POA: Insufficient documentation

## 2019-09-02 DIAGNOSIS — E119 Type 2 diabetes mellitus without complications: Secondary | ICD-10-CM | POA: Diagnosis not present

## 2019-09-02 DIAGNOSIS — I10 Essential (primary) hypertension: Secondary | ICD-10-CM | POA: Diagnosis not present

## 2019-09-02 DIAGNOSIS — R0789 Other chest pain: Secondary | ICD-10-CM | POA: Diagnosis not present

## 2019-09-02 DIAGNOSIS — Z79899 Other long term (current) drug therapy: Secondary | ICD-10-CM | POA: Diagnosis not present

## 2019-09-02 HISTORY — DX: Localized edema: R60.0

## 2019-09-02 HISTORY — DX: Hyperlipidemia, unspecified: E78.5

## 2019-09-02 HISTORY — DX: Depression, unspecified: F32.A

## 2019-09-02 LAB — BASIC METABOLIC PANEL
Anion gap: 11 (ref 5–15)
BUN: 35 mg/dL — ABNORMAL HIGH (ref 8–23)
CO2: 27 mmol/L (ref 22–32)
Calcium: 9.9 mg/dL (ref 8.9–10.3)
Chloride: 98 mmol/L (ref 98–111)
Creatinine, Ser: 2.53 mg/dL — ABNORMAL HIGH (ref 0.61–1.24)
GFR calc Af Amer: 29 mL/min — ABNORMAL LOW (ref >60)
GFR calc non Af Amer: 25 mL/min — ABNORMAL LOW (ref >60)
Glucose, Bld: 249 mg/dL — ABNORMAL HIGH (ref 70–99)
Potassium: 4 mmol/L (ref 3.5–5.1)
Sodium: 136 mmol/L (ref 135–145)

## 2019-09-02 LAB — DIFFERENTIAL
Abs Immature Granulocytes: 0.02 10*3/uL (ref 0.00–0.07)
Basophils Absolute: 0 10*3/uL (ref 0.0–0.1)
Basophils Relative: 1 %
Eosinophils Absolute: 0.3 10*3/uL (ref 0.0–0.5)
Eosinophils Relative: 6 %
Immature Granulocytes: 0 %
Lymphocytes Relative: 21 %
Lymphs Abs: 1.2 10*3/uL (ref 0.7–4.0)
Monocytes Absolute: 0.5 10*3/uL (ref 0.1–1.0)
Monocytes Relative: 8 %
Neutro Abs: 3.7 10*3/uL (ref 1.7–7.7)
Neutrophils Relative %: 64 %

## 2019-09-02 LAB — CBC
HCT: 38.5 % — ABNORMAL LOW (ref 39.0–52.0)
Hemoglobin: 12 g/dL — ABNORMAL LOW (ref 13.0–17.0)
MCH: 25.1 pg — ABNORMAL LOW (ref 26.0–34.0)
MCHC: 31.2 g/dL (ref 30.0–36.0)
MCV: 80.4 fL (ref 80.0–100.0)
Platelets: 188 10*3/uL (ref 150–400)
RBC: 4.79 MIL/uL (ref 4.22–5.81)
RDW: 13.8 % (ref 11.5–15.5)
WBC: 5.7 10*3/uL (ref 4.0–10.5)
nRBC: 0 % (ref 0.0–0.2)

## 2019-09-02 LAB — TROPONIN I (HIGH SENSITIVITY)
Troponin I (High Sensitivity): 8 ng/L (ref <18)
Troponin I (High Sensitivity): 9 ng/L (ref <18)

## 2019-09-02 LAB — HEPATIC FUNCTION PANEL
ALT: 21 U/L (ref 0–44)
AST: 18 U/L (ref 15–41)
Albumin: 4 g/dL (ref 3.5–5.0)
Alkaline Phosphatase: 61 U/L (ref 38–126)
Bilirubin, Direct: 0.1 mg/dL (ref 0.0–0.2)
Indirect Bilirubin: 0.7 mg/dL (ref 0.3–0.9)
Total Bilirubin: 0.8 mg/dL (ref 0.3–1.2)
Total Protein: 7 g/dL (ref 6.5–8.1)

## 2019-09-02 MED ORDER — TRAMADOL HCL 50 MG PO TABS: 50.0000 mg | ORAL_TABLET | Freq: Four times a day (QID) | ORAL | 0 refills | Status: AC | PRN

## 2019-09-02 NOTE — ED Triage Notes (Signed)
Pt reports chest pain that moves across chest only upon exertion. Pain goes away when he remains still. Occurring for 2 days

## 2019-09-02 NOTE — ED Provider Notes (Signed)
[facility] Provider Note   CSN: [id number] Arrival date & time: 09/02/19  1129     History Chief Complaint  Patient presents with  . Chest Pain    Jim [name] is a 69 y.o. male.  Patient complains of left-sided chest pain but much worse with movement.  No shortness of breath no sweating  The history is provided by the patient. No language interpreter was used.  Chest Pain Pain location:  L chest Pain quality: aching   Pain radiates to:  Does not radiate Pain severity:  Mild Onset quality:  Gradual Timing:  Intermittent Progression:  Unchanged Chronicity:  New Context: movement   Associated symptoms: no abdominal pain, no back pain, no cough, no fatigue and no headache        Past Medical History:  Diagnosis Date  . Chronic kidney disease    cyst on left kidney   . Degenerative disc disease, lumbar   . Depression   . Diabetes mellitus   . GERD (gastroesophageal reflux disease)   . Hx of radiation therapy 12/13/12- 01/27/13   prostate bed 6600 cGy 33 sessions  . Hyperlipidemia   . Hypertension   . Pedal edema   . Prostate cancer ([place]) 06/11/12   gleason 7  . Stroke [facility])     Patient Active Problem List   Diagnosis Date Noted  . Encounter for screening colonoscopy 02/20/2016  . UTI (urinary tract infection) 10/14/2014  . Right sided weakness 10/12/2014  . CVA (cerebral vascular accident) ([place]) 09/06/2014  . Left-sided weakness 09/06/2014  . Hyponatremia 09/06/2014  . Non compliance with medical treatment 09/06/2014  . CVA (cerebral infarction) 09/06/2014  . Hypertension   . Diabetes ([place])   . Chronic kidney disease   . GERD (gastroesophageal reflux disease)   . Hx of radiation therapy   . Prostate cancer ([place]) 06/11/2012    Past Surgical History:  Procedure Laterality Date  . COLONOSCOPY N/A 03/07/2016   Procedure: COLONOSCOPY;  Surgeon: Daneil Dolin, MD;  Location: [facility] SUITE;  Service: Endoscopy;  Laterality: N/A;   7:30 am  . LYMPHADENECTOMY  06/11/2012   Procedure: LYMPHADENECTOMY;  Surgeon: Alexis Frock, MD;  Location: [facility] ORS;  Service: Urology;  Laterality: Bilateral;  . PROSTATE BIOPSY  03/25/2012  . ROBOT ASSISTED LAPAROSCOPIC RADICAL PROSTATECTOMY  06/11/2012   Procedure: ROBOTIC ASSISTED LAPAROSCOPIC RADICAL PROSTATECTOMY;  Surgeon: Alexis Frock, MD;  Location: [facility] ORS;  Service: Urology;  Laterality: N/A;  1st procedure: Robotic Left Renal Cyst Decortication   . ROBOTIC ASSITED PARTIAL NEPHRECTOMY  06/11/2012   Procedure: ROBOTIC ASSITED PARTIAL NEPHRECTOMY;  Surgeon: Alexis Frock, MD;  Location: [facility] ORS;  Service: Urology;  Laterality: Left;       Family History  Problem Relation Age of Onset  . Cancer Mother        pancreatic  . Cancer Father        lung  . Cancer Brother        prostate-surgery 6 years ago  . Colon cancer Neg Hx     Social History   Tobacco Use  . Smoking status: Never Smoker  . Smokeless tobacco: Never Used  Substance Use Topics  . Alcohol use: No    Comment: no use in 1 year per patient   . Drug use: No    Home Medications Prior to Admission medications   Medication Sig Start Date End Date Taking? Authorizing Provider  amLODipine (NORVASC) 10 MG tablet Take 10 mg by mouth  daily.    [provider]  aspirin EC 81 MG tablet Take 162 mg by mouth daily.    [provider]  atorvastatin (LIPITOR) 80 MG tablet Take 1 tablet (80 mg total) by mouth daily at 6 PM. 09/08/14   Orvan Falconer, MD  diclofenac sodium (VOLTAREN) 1 % GEL Apply 2 g topically every 6 (six) hours as needed.    [provider]  docusate sodium (COLACE) 100 MG capsule Take 100 mg by mouth 2 (two) times daily.    [provider]  gabapentin (NEURONTIN) 100 MG capsule Take 100 mg by mouth 3 (three) times daily.    [provider]  glipiZIDE (GLUCOTROL XL) 10 MG 24 hr tablet Take 10 mg by mouth daily with breakfast.    [provider]    hydrochlorothiazide (HYDRODIURIL) 25 MG tablet Take 25 mg by mouth daily.    [provider]  insulin detemir (LEVEMIR) 100 UNIT/ML injection Inject 0.13 mLs (13 Units total) into the skin daily. Patient taking differently: Inject 8 Units into the skin daily.  09/07/14   Black, Lezlie Octave, NP  metFORMIN (GLUCOPHAGE-XR) 500 MG 24 hr tablet Take 1 tablet (500 mg total) by mouth 2 (two) times daily. Patient taking differently: Take 1,000 mg by mouth 2 (two) times daily.  10/14/14   Kathie Dike, MD  metoprolol succinate (TOPROL-XL) 50 MG 24 hr tablet Take 1 tablet (50 mg total) by mouth daily. Patient taking differently: Take 200 mg by mouth daily.  09/08/14   Orvan Falconer, MD  polyethylene glycol-electrolytes (TRILYTE) 420 g solution Take 4,000 mLs by mouth as directed. 02/21/16   Rourk, Cristopher Estimable, MD  pyridoxine (B-6) 100 MG tablet Take 100 mg by mouth 2 (two) times daily.    [provider]  ranitidine (ZANTAC) 150 MG capsule Take 1 capsule by mouth 2 (two) times daily as needed for heartburn.  09/14/14   [provider]  traMADol (ULTRAM) 50 MG tablet Take 1 tablet (50 mg total) by mouth at bedtime. 06/14/15   Carole Civil, MD    Allergies    Patient has no known allergies.  Review of Systems   Review of Systems  Constitutional: Negative for appetite change and fatigue.  HENT: Negative for congestion, ear discharge and sinus pressure.   Eyes: Negative for discharge.  Respiratory: Negative for cough.   Cardiovascular: Positive for chest pain.  Gastrointestinal: Negative for abdominal pain and diarrhea.  Genitourinary: Negative for frequency and hematuria.  Musculoskeletal: Negative for back pain.  Skin: Negative for rash.  Neurological: Negative for seizures and headaches.  Psychiatric/Behavioral: Negative for hallucinations.    Physical Exam Updated Vital Signs BP (!) 157/83   Pulse 64   Temp 98.2 F (36.8 C) (Oral)   Resp 16   Ht 5\' 8"  (1.727 m)   Wt  91.2 kg   SpO2 99%   BMI 30.56 kg/m   Physical Exam Vitals and nursing note reviewed.  Constitutional:      Appearance: He is well-developed.  HENT:     Head: Normocephalic.     Nose: Nose normal.  Eyes:     General: No scleral icterus.    Conjunctiva/sclera: Conjunctivae normal.  Neck:     Thyroid: No thyromegaly.  Cardiovascular:     Rate and Rhythm: Normal rate and regular rhythm.     Heart sounds: No murmur. No friction rub. No gallop.   Pulmonary:     Breath sounds: No stridor. No  wheezing or rales.  Chest:     Chest wall: Tenderness present.  Abdominal:     General: There is no distension.     Tenderness: There is no abdominal tenderness. There is no rebound.  Musculoskeletal:        General: Normal range of motion.     Cervical back: Neck supple.  Lymphadenopathy:     Cervical: No cervical adenopathy.  Skin:    Findings: No erythema or rash.  Neurological:     Mental Status: He is alert and oriented to person, place, and time.     Motor: No abnormal muscle tone.     Coordination: Coordination normal.  Psychiatric:        Behavior: Behavior normal.     ED Results / Procedures / Treatments   Labs (all labs ordered are listed, but only abnormal results are displayed) Labs Reviewed  BASIC METABOLIC PANEL - Abnormal; Notable for the following components:      Result Value   Glucose, Bld 249 (*)    BUN 35 (*)    Creatinine, Ser 2.53 (*)    GFR calc non Af Amer 25 (*)    GFR calc Af Amer 29 (*)    All other components within normal limits  CBC - Abnormal; Notable for the following components:   Hemoglobin 12.0 (*)    HCT 38.5 (*)    MCH 25.1 (*)    All other components within normal limits  DIFFERENTIAL  HEPATIC FUNCTION PANEL  TROPONIN I (HIGH SENSITIVITY)  TROPONIN I (HIGH SENSITIVITY)    EKG EKG Interpretation  Date/Time:  Friday September 02 2019 11:46:35 EDT Ventricular Rate:  70 PR Interval:    QRS Duration: 88 QT Interval:  382 QTC  Calculation: 413 R Axis:   -28 Text Interpretation: Sinus rhythm Borderline left axis deviation Abnormal R-wave progression, late transition Confirmed by Milton Ferguson [phone]) on 09/02/2019 11:53:42 AM Also confirmed by Milton Ferguson [phone])  on 09/02/2019 2:46:46 PM   Radiology DG Chest Port 1 View  Result Date: 09/02/2019 CLINICAL DATA:  Chest pain on exertion. Symptoms for 2 days. History of diabetes and hypertension. EXAM: PORTABLE CHEST 1 VIEW COMPARISON:  Radiographs 09/06/2014 and 07/09/2011. FINDINGS: 1220 hours. Two views were obtained. There are low lung volumes with probable focal atelectasis at the right lung base. There is no confluent airspace opacity, pleural effusion or pneumothorax. The bones appear unremarkable. Telemetry leads overlie the chest. IMPRESSION: Probable focal right basilar atelectasis. No other evidence of active cardiopulmonary process. Electronically Signed   By: Richardean Sale M.D.   On: 09/02/2019 12:31    Procedures Procedures (including critical care time)  Medications Ordered in ED Medications - No data to display  ED Course  I have reviewed the triage vital signs and the nursing notes.  Pertinent labs & imaging results that were available during my care of the patient were reviewed by me and considered in my medical decision making (see chart for details).    MDM Rules/Calculators/A&P                      Patient with atypical chest pain.  He will be discharged home and told to take Tylenol if his second troponin is negative     This patient presents to the ED for concern of chest pain, this involves an extensive number of treatment options, and is a complaint that carries with it a high risk of complications and morbidity.  The  differential diagnosis includes coronary artery disease muscle skeletal pain GI disease   Lab Tests:   I Ordered, reviewed, and interpreted labs, which included CBC chemistries troponins.  Patient does have elevated  creatinine which has been chronic and he is taking care for kidney disease he also has mildly low hemoglobin.  First troponin negative second troponin pending  Medicines ordered:     Imaging Studies ordered:   I ordered imaging studies which included chest x-ray and  I independently visualized and interpreted imaging which showed mild atelectasis  Additional history obtained:   Additional history obtained from old records  Previous records obtained and reviewed   Consultations Obtained:   Reevaluation: Second troponin was normal.  Patient with chest wall pain.  He is sent home with Ultram to help with the discomfort and follow-up with PCP  Critical Interventions:  .   Final Clinical Impression(s) / ED Diagnoses Final diagnoses:  None    Rx / DC Orders ED Discharge Orders    None       Milton Ferguson, MD 09/02/19 [phone]

## 2019-09-02 NOTE — Discharge Instructions (Addendum)
Follow-up with your regular doctor next week.  And take Ultram if Tylenol does not help with the discomfort

## 2019-11-15 ENCOUNTER — Other Ambulatory Visit: Payer: Self-pay

## 2019-11-15 ENCOUNTER — Emergency Department (HOSPITAL_COMMUNITY)
Admission: EM | Admit: 2019-11-15 | Discharge: 2019-11-15 | Disposition: A | Discharge status: Home / Self Care | Payer: Medicare Other | Attending: Emergency Medicine | Admitting: Emergency Medicine

## 2019-11-15 ENCOUNTER — Emergency Department (HOSPITAL_COMMUNITY): Payer: Medicare Other

## 2019-11-15 ENCOUNTER — Encounter (HOSPITAL_COMMUNITY): Payer: Self-pay | Admitting: Emergency Medicine

## 2019-11-15 DIAGNOSIS — E119 Type 2 diabetes mellitus without complications: Secondary | ICD-10-CM | POA: Insufficient documentation

## 2019-11-15 DIAGNOSIS — R05 Cough: Secondary | ICD-10-CM | POA: Insufficient documentation

## 2019-11-15 DIAGNOSIS — I10 Essential (primary) hypertension: Secondary | ICD-10-CM | POA: Insufficient documentation

## 2019-11-15 DIAGNOSIS — R053 Chronic cough: Secondary | ICD-10-CM

## 2019-11-15 MED ORDER — FAMOTIDINE 20 MG PO TABS: 20.0000 mg | ORAL_TABLET | Freq: Two times a day (BID) | ORAL | 0 refills | Status: AC

## 2019-11-15 MED ORDER — BENZONATATE 100 MG PO CAPS: 100.0000 mg | ORAL_CAPSULE | Freq: Three times a day (TID) | ORAL | 0 refills | Status: DC

## 2019-11-15 MED ORDER — MUCINEX 600 MG PO TB12: 600.0000 mg | ORAL_TABLET | Freq: Two times a day (BID) | ORAL | 0 refills | Status: AC | PRN

## 2019-11-15 NOTE — ED Provider Notes (Signed)
[facility] Emergency Department Provider Note MRN:  [id number]  Arrival date & time: 11/15/19     Chief Complaint   Cough   History of Present Illness   Jim [name] is a 69 y.o. year-old male with a history of CKD, diabetes, stroke presenting to the ED with chief complaint of cough.  5 or 6 weeks of persistent dry cough. Worse at night when trying to sleep. Denies shortness of breath, no chest pain, no fever, no abdominal pain, no other complaints. Symptoms mild to moderate, constant.  Review of Systems  A problem-focused ROS was performed. Positive for cough.  Patient denies fever.  Patient's Health History    Past Medical History:  Diagnosis Date  . Chronic kidney disease    cyst on left kidney   . Degenerative disc disease, lumbar   . Depression   . Diabetes mellitus   . GERD (gastroesophageal reflux disease)   . Hx of radiation therapy 12/13/12- 01/27/13   prostate bed 6600 cGy 33 sessions  . Hyperlipidemia   . Hypertension   . Pedal edema   . Prostate cancer ([place]) 06/11/12   gleason 7  . Stroke [facility])     Past Surgical History:  Procedure Laterality Date  . COLONOSCOPY N/A 03/07/2016   Procedure: COLONOSCOPY;  Surgeon: Daneil Dolin, MD;  Location: [facility] ENDO SUITE;  Service: Endoscopy;  Laterality: N/A;  7:30 am  . LYMPHADENECTOMY  06/11/2012   Procedure: LYMPHADENECTOMY;  Surgeon: Alexis Frock, MD;  Location: [facility] ORS;  Service: Urology;  Laterality: Bilateral;  . PROSTATE BIOPSY  03/25/2012  . ROBOT ASSISTED LAPAROSCOPIC RADICAL PROSTATECTOMY  06/11/2012   Procedure: ROBOTIC ASSISTED LAPAROSCOPIC RADICAL PROSTATECTOMY;  Surgeon: Alexis Frock, MD;  Location: [facility] ORS;  Service: Urology;  Laterality: N/A;  1st procedure: Robotic Left Renal Cyst Decortication   . ROBOTIC ASSITED PARTIAL NEPHRECTOMY  06/11/2012   Procedure: ROBOTIC ASSITED PARTIAL NEPHRECTOMY;  Surgeon: Alexis Frock, MD;  Location: [facility] ORS;  Service: Urology;  Laterality:  Left;    Family History  Problem Relation Age of Onset  . Cancer Mother        pancreatic  . Cancer Father        lung  . Cancer Brother        prostate-surgery 6 years ago  . Colon cancer Neg Hx     Social History   Socioeconomic History  . Marital status: Married    Spouse name: Not on file  . Number of children: Not on file  . Years of education: Not on file  . Highest education level: Not on file  Occupational History  . Not on file  Tobacco Use  . Smoking status: Never Smoker  . Smokeless tobacco: Never Used  Substance and Sexual Activity  . Alcohol use: No    Comment: no use in 1 year per patient   . Drug use: No  . Sexual activity: Not on file  Other Topics Concern  . Not on file  Social History Narrative  . Not on file   Social Determinants of Health   Financial Resource Strain:   . Difficulty of Paying Living Expenses:   Food Insecurity:   . Worried About Charity fundraiser in the Last Year:   . Arboriculturist in the Last Year:   Transportation Needs:   . Film/video editor (Medical):   Marland Kitchen Lack of Transportation (Non-Medical):   Physical Activity:   . Days of Exercise per Week:   .  Minutes of Exercise per Session:   Stress:   . Feeling of Stress :   Social Connections:   . Frequency of Communication with Friends and Family:   . Frequency of Social Gatherings with Friends and Family:   . Attends Religious Services:   . Active Member of Clubs or Organizations:   . Attends Archivist Meetings:   Marland Kitchen Marital Status:   Intimate Partner Violence:   . Fear of Current or Ex-Partner:   . Emotionally Abused:   Marland Kitchen Physically Abused:   . Sexually Abused:      Physical Exam   Vitals:   11/15/19 1002  BP: (!) 169/81  Pulse: 75  Resp: 17  Temp: 98.2 F (36.8 C)  SpO2: 97%    CONSTITUTIONAL: Well-appearing, NAD NEURO:  Alert and oriented x 3, no focal deficits EYES:  eyes equal and reactive ENT/NECK:  no LAD, no JVD CARDIO: Regular  rate, well-perfused, normal S1 and S2 PULM:  CTAB no wheezing or rhonchi GI/GU:  normal bowel sounds, non-distended, non-tender MSK/SPINE:  No gross deformities, no edema SKIN:  no rash, atraumatic PSYCH:  Appropriate speech and behavior  *Additional and/or pertinent findings included in MDM below  Diagnostic and Interventional Summary    EKG Interpretation  Date/Time:    Ventricular Rate:    PR Interval:    QRS Duration:   QT Interval:    QTC Calculation:   R Axis:     Text Interpretation:        Labs Reviewed - No data to display  DG Chest Portable 1 View  Final Result      Medications - No data to display   Procedures  /  Critical Care Procedures  ED Course and Medical Decision Making  I have reviewed the triage vital signs, the nursing notes, and pertinent available records from the EMR.  Listed above are laboratory and imaging tests that I personally ordered, reviewed, and interpreted and then considered in my medical decision making (see below for details).      Well-appearing, in no acute distress, normal vital signs, lungs clear. Chronic cough could be due to bronchitis, pneumonia, GERD. Patient has no recent exposure to prisoners probably homeless, no recent travel, doubt tuberculosis. X-ray is reassuring, appropriate for discharge with symptomatic management and PCP follow-up.    Barth Kirks. Sedonia Small, [place] [email or web address]  Final Clinical Impressions(s) / ED Diagnoses     ICD-10-CM   1. Chronic cough  R05     ED Discharge Orders         Ordered    benzonatate (TESSALON) 100 MG capsule  Every 8 hours     Discontinue  Reprint     11/15/19 1216    famotidine (PEPCID) 20 MG tablet  2 times daily     Discontinue  Reprint     11/15/19 1216    guaiFENesin (MUCINEX) 600 MG 12 hr tablet  2 times daily PRN     Discontinue  Reprint     11/15/19 1216           Discharge Instructions Discussed with  and Provided to Patient:     Discharge Instructions     You were evaluated in the Emergency Department and after careful evaluation, we did not find any emergent condition requiring admission or further testing in the hospital.  Your exam/testing today was overall reassuring. Your cough may be due to bronchitis or due to acid  reflux. Your chest x-ray today was normal. Please take the medications as directed and follow-up with your regular doctor.  Please return to the Emergency Department if you experience any worsening of your condition.  We encourage you to follow up with a primary care provider.  Thank you for allowing [place] to be a part of your care.       Maudie Flakes, MD 11/15/19 [phone]

## 2019-11-15 NOTE — ED Triage Notes (Signed)
Pt c/o of nonproductive cough  X 3 weeks.

## 2019-11-15 NOTE — Discharge Instructions (Addendum)
You were evaluated in the Emergency Department and after careful evaluation, we did not find any emergent condition requiring admission or further testing in the hospital.  Your exam/testing today was overall reassuring. Your cough may be due to bronchitis or due to acid reflux. Your chest x-ray today was normal. Please take the medications as directed and follow-up with your regular doctor.  Please return to the Emergency Department if you experience any worsening of your condition.  We encourage you to follow up with a primary care provider.  Thank you for allowing [place] to be a part of your care.

## 2022-08-07 ENCOUNTER — Other Ambulatory Visit: Payer: Self-pay

## 2022-08-07 ENCOUNTER — Encounter (HOSPITAL_COMMUNITY): Payer: Self-pay

## 2022-08-07 ENCOUNTER — Emergency Department (HOSPITAL_COMMUNITY)
Admission: EM | Admit: 2022-08-07 | Discharge: 2022-08-07 | Disposition: A | Discharge status: Home / Self Care | Payer: Medicare Other | Attending: Emergency Medicine | Admitting: Emergency Medicine

## 2022-08-07 DIAGNOSIS — R052 Subacute cough: Secondary | ICD-10-CM

## 2022-08-07 DIAGNOSIS — Z7982 Long term (current) use of aspirin: Secondary | ICD-10-CM | POA: Diagnosis not present

## 2022-08-07 DIAGNOSIS — U071 COVID-19: Secondary | ICD-10-CM

## 2022-08-07 DIAGNOSIS — R059 Cough, unspecified: Secondary | ICD-10-CM | POA: Diagnosis present

## 2022-08-07 DIAGNOSIS — Z794 Long term (current) use of insulin: Secondary | ICD-10-CM | POA: Insufficient documentation

## 2022-08-07 LAB — RESP PANEL BY RT-PCR (RSV, FLU A&B, COVID)  RVPGX2
Influenza A by PCR: NEGATIVE
Influenza B by PCR: NEGATIVE
Resp Syncytial Virus by PCR: NEGATIVE
SARS Coronavirus 2 by RT PCR: POSITIVE — AB

## 2022-08-07 MED ORDER — BENZONATATE 100 MG PO CAPS: 100.0000 mg | ORAL_CAPSULE | Freq: Three times a day (TID) | ORAL | 0 refills | Status: AC

## 2022-08-07 NOTE — ED Provider Notes (Signed)
[facility] Provider Note   CSN: [id number] Arrival date & time: 08/07/22  1057     History Chief Complaint  Patient presents with   Cough    Jim [name] is a 72 y.o. male patient who presents to the emergency department today for evaluation of a cough it has been ongoing for about 10 days.  Patient states he initially started having sore throat and general malaise.  That is since resolved and he is now been having a lingering cough that is nonproductive.  Has been trying over-the-counter regimen's like Mucinex and tea with honey with little relief.  He denies any fever or chills.   Cough      Home Medications Prior to Admission medications   Medication Sig Start Date End Date Taking? Authorizing Provider  benzonatate (TESSALON) 100 MG capsule Take 1 capsule (100 mg total) by mouth every 8 (eight) hours. 08/07/22  Yes Raul Del, Kilo Eshelman M, PA-C  amLODipine (NORVASC) 10 MG tablet Take 10 mg by mouth daily.    [provider]  aspirin EC 81 MG tablet Take 162 mg by mouth daily.    [provider]  atorvastatin (LIPITOR) 80 MG tablet Take 1 tablet (80 mg total) by mouth daily at 6 PM. 09/08/14   Orvan Falconer, MD  cetirizine (ZYRTEC) 10 MG tablet Take 10 mg by mouth daily.    [provider]  diclofenac sodium (VOLTAREN) 1 % GEL Apply 2 g topically every 6 (six) hours as needed.    [provider]  docusate sodium (COLACE) 100 MG capsule Take 100 mg by mouth 2 (two) times daily.    [provider]  famotidine (PEPCID) 20 MG tablet Take 1 tablet (20 mg total) by mouth 2 (two) times daily. 11/15/19   Maudie Flakes, MD  guaiFENesin (MUCINEX) 600 MG 12 hr tablet Take 1 tablet (600 mg total) by mouth 2 (two) times daily as needed. 11/15/19   Maudie Flakes, MD  hydrochlorothiazide (HYDRODIURIL) 25 MG tablet Take 25 mg by mouth daily.    [provider]  insulin detemir (LEVEMIR) 100 UNIT/ML  injection Inject 0.13 mLs (13 Units total) into the skin daily. Patient taking differently: Inject 25 Units into the skin 2 (two) times daily.  09/07/14   Black, Lezlie Octave, NP  JARDIANCE 10 MG TABS tablet Take 10 mg by mouth daily. 10/07/19   [provider]  losartan (COZAAR) 25 MG tablet Take 25 mg by mouth daily. 08/17/19   [provider]  metFORMIN (GLUCOPHAGE-XR) 500 MG 24 hr tablet Take 1 tablet (500 mg total) by mouth 2 (two) times daily. Patient taking differently: Take 1,000 mg by mouth 2 (two) times daily.  10/14/14   Kathie Dike, MD  metoprolol succinate (TOPROL-XL) 50 MG 24 hr tablet Take 1 tablet (50 mg total) by mouth daily. Patient taking differently: Take 200 mg by mouth daily.  09/08/14   Orvan Falconer, MD  omeprazole (PRILOSEC) 20 MG capsule Take 20 mg by mouth daily.    [provider]  polyethylene glycol-electrolytes (TRILYTE) 420 g solution Take 4,000 mLs by mouth as directed. Patient not taking: Reported on 11/15/2019 02/21/16   Daneil Dolin, MD  traMADol (ULTRAM) 50 MG tablet Take 1 tablet (50 mg total) by mouth every 6 (six) hours as needed. Patient not taking: Reported on 11/15/2019 09/02/19   Milton Ferguson, MD      Allergies    Patient has no known  allergies.    Review of Systems   Review of Systems  Respiratory:  Positive for cough.   All other systems reviewed and are negative.   Physical Exam Updated Vital Signs BP 115/60 (BP Location: Right Arm)   Pulse 89   Temp 98.3 F (36.8 C) (Oral)   Resp 18   Ht '5\' 8"'$  (1.727 m)   Wt 89.4 kg   SpO2 97%   BMI 29.95 kg/m  Physical Exam Vitals and nursing note reviewed.  Constitutional:      General: He is not in acute distress.    Appearance: Normal appearance.  HENT:     Head: Normocephalic and atraumatic.  Eyes:     General:        Right eye: No discharge.        Left eye: No discharge.  Cardiovascular:     Comments: Regular rate and rhythm.  S1/S2 are distinct without any  evidence of murmur, rubs, or gallops.  Radial pulses are 2+ bilaterally.  Dorsalis pedis pulses are 2+ bilaterally.  No evidence of pedal edema. Pulmonary:     Comments: Clear to auscultation bilaterally.  Normal effort.  No respiratory distress.  No evidence of wheezes, rales, or rhonchi heard throughout.  Coughing on exam. Abdominal:     General: Abdomen is flat. Bowel sounds are normal. There is no distension.     Tenderness: There is no abdominal tenderness. There is no guarding or rebound.  Musculoskeletal:        General: Normal range of motion.     Cervical back: Neck supple.  Skin:    General: Skin is warm and dry.     Findings: No rash.  Neurological:     General: No focal deficit present.     Mental Status: He is alert.  Psychiatric:        Mood and Affect: Mood normal.        Behavior: Behavior normal.     ED Results / Procedures / Treatments   Labs (all labs ordered are listed, but only abnormal results are displayed) Labs Reviewed  RESP PANEL BY RT-PCR (RSV, FLU A&B, COVID)  [id number] - Abnormal; Notable for the following components:      Result Value   SARS Coronavirus 2 by RT PCR POSITIVE (*)    All other components within normal limits    EKG None  Radiology No results found.  Procedures Procedures    Medications Ordered in ED Medications - No data to display  ED Course/ Medical Decision Making/ A&P   {   Click here for ABCD2, HEART and other calculators  Medical Decision Making Jim [name] is a 72 y.o. male patient who presents to the emergency department today for further evaluation of a cough.  I would low suspicion for pneumonia at this time.  Lungs are clear to auscultation despite him coughing on exam.  Does have a hoarse voice.  Patient did test positive for COVID today.  I will plan to give him Tessalon Perles.  This is likely postviral bronchitis.  I will have him try this for 5 days and follow-up with his primary care doctor for further  evaluation.  Strict turn precautions were discussed.  He is safe for discharge.     Final Clinical Impression(s) / ED Diagnoses Final diagnoses:  Subacute cough  COVID    Rx / DC Orders ED Discharge Orders          Ordered    benzonatate (  TESSALON) 100 MG capsule  Every 8 hours        08/07/22 1342              Myna Bright [place], [place] 08/07/22 1342    Milton Ferguson, MD 08/08/22 1710

## 2022-08-07 NOTE — ED Triage Notes (Signed)
Pt complaining of a cough that has been going on since the 3 rd of march.  Not going away and he seems to be feeling weak

## 2022-08-07 NOTE — Discharge Instructions (Addendum)
As we discussed, I do believe this is likely bronchitis secondary to the [place].  Please take Tessalon Perles as prescribed.  I would like for you to follow-up with your primary care doctor in 5 days if you are not feeling better.  Please return to the emergency room for any worsening symptoms you might have.

## 2024-06-07 ENCOUNTER — Emergency Department (HOSPITAL_COMMUNITY)

## 2024-06-07 ENCOUNTER — Emergency Department (HOSPITAL_COMMUNITY)
Admission: EM | Admit: 2024-06-07 | Discharge: 2024-06-07 | Disposition: A | Discharge status: Home / Self Care | Attending: Emergency Medicine | Admitting: Emergency Medicine

## 2024-06-07 ENCOUNTER — Other Ambulatory Visit: Payer: Self-pay

## 2024-06-07 ENCOUNTER — Encounter (HOSPITAL_COMMUNITY): Payer: Self-pay | Admitting: Emergency Medicine

## 2024-06-07 DIAGNOSIS — R55 Syncope and collapse: Secondary | ICD-10-CM | POA: Insufficient documentation

## 2024-06-07 DIAGNOSIS — W1830XA Fall on same level, unspecified, initial encounter: Secondary | ICD-10-CM | POA: Insufficient documentation

## 2024-06-07 DIAGNOSIS — Z7984 Long term (current) use of oral hypoglycemic drugs: Secondary | ICD-10-CM | POA: Diagnosis not present

## 2024-06-07 DIAGNOSIS — I672 Cerebral atherosclerosis: Secondary | ICD-10-CM | POA: Diagnosis not present

## 2024-06-07 DIAGNOSIS — Z794 Long term (current) use of insulin: Secondary | ICD-10-CM | POA: Insufficient documentation

## 2024-06-07 DIAGNOSIS — I129 Hypertensive chronic kidney disease with stage 1 through stage 4 chronic kidney disease, or unspecified chronic kidney disease: Secondary | ICD-10-CM | POA: Diagnosis not present

## 2024-06-07 DIAGNOSIS — Z79899 Other long term (current) drug therapy: Secondary | ICD-10-CM | POA: Diagnosis not present

## 2024-06-07 DIAGNOSIS — E1122 Type 2 diabetes mellitus with diabetic chronic kidney disease: Secondary | ICD-10-CM | POA: Insufficient documentation

## 2024-06-07 DIAGNOSIS — G319 Degenerative disease of nervous system, unspecified: Secondary | ICD-10-CM | POA: Diagnosis not present

## 2024-06-07 DIAGNOSIS — Z7982 Long term (current) use of aspirin: Secondary | ICD-10-CM | POA: Diagnosis not present

## 2024-06-07 DIAGNOSIS — I700 Atherosclerosis of aorta: Secondary | ICD-10-CM | POA: Insufficient documentation

## 2024-06-07 DIAGNOSIS — K449 Diaphragmatic hernia without obstruction or gangrene: Secondary | ICD-10-CM | POA: Diagnosis not present

## 2024-06-07 DIAGNOSIS — K5730 Diverticulosis of large intestine without perforation or abscess without bleeding: Secondary | ICD-10-CM | POA: Diagnosis not present

## 2024-06-07 DIAGNOSIS — E875 Hyperkalemia: Secondary | ICD-10-CM | POA: Diagnosis not present

## 2024-06-07 DIAGNOSIS — S0081XA Abrasion of other part of head, initial encounter: Secondary | ICD-10-CM | POA: Diagnosis not present

## 2024-06-07 DIAGNOSIS — I2510 Atherosclerotic heart disease of native coronary artery without angina pectoris: Secondary | ICD-10-CM | POA: Insufficient documentation

## 2024-06-07 DIAGNOSIS — S0990XA Unspecified injury of head, initial encounter: Secondary | ICD-10-CM | POA: Diagnosis present

## 2024-06-07 DIAGNOSIS — Z8673 Personal history of transient ischemic attack (TIA), and cerebral infarction without residual deficits: Secondary | ICD-10-CM | POA: Diagnosis not present

## 2024-06-07 DIAGNOSIS — R9082 White matter disease, unspecified: Secondary | ICD-10-CM | POA: Diagnosis not present

## 2024-06-07 DIAGNOSIS — N189 Chronic kidney disease, unspecified: Secondary | ICD-10-CM | POA: Diagnosis not present

## 2024-06-07 LAB — URINALYSIS, ROUTINE W REFLEX MICROSCOPIC
Bacteria, UA: NONE SEEN
Bilirubin Urine: NEGATIVE
Glucose, UA: 150 mg/dL — AB
Ketones, ur: NEGATIVE mg/dL
Nitrite: NEGATIVE
Protein, ur: 100 mg/dL — AB
Specific Gravity, Urine: 1.012 (ref 1.005–1.030)
pH: 6 (ref 5.0–8.0)

## 2024-06-07 LAB — CBC WITH DIFFERENTIAL/PLATELET
Abs Immature Granulocytes: 0.03 K/uL (ref 0.00–0.07)
Basophils Absolute: 0 K/uL (ref 0.0–0.1)
Basophils Relative: 0 %
Eosinophils Absolute: 0.3 K/uL (ref 0.0–0.5)
Eosinophils Relative: 5 %
HCT: 37.5 % — ABNORMAL LOW (ref 39.0–52.0)
Hemoglobin: 11.8 g/dL — ABNORMAL LOW (ref 13.0–17.0)
Immature Granulocytes: 1 %
Lymphocytes Relative: 14 %
Lymphs Abs: 0.9 K/uL (ref 0.7–4.0)
MCH: 26.3 pg (ref 26.0–34.0)
MCHC: 31.5 g/dL (ref 30.0–36.0)
MCV: 83.7 fL (ref 80.0–100.0)
Monocytes Absolute: 0.4 K/uL (ref 0.1–1.0)
Monocytes Relative: 6 %
Neutro Abs: 4.6 K/uL (ref 1.7–7.7)
Neutrophils Relative %: 74 %
Platelets: 152 K/uL (ref 150–400)
RBC: 4.48 MIL/uL (ref 4.22–5.81)
RDW: 12.2 % (ref 11.5–15.5)
WBC: 6.3 K/uL (ref 4.0–10.5)
nRBC: 0 % (ref 0.0–0.2)

## 2024-06-07 LAB — COMPREHENSIVE METABOLIC PANEL WITH GFR
ALT: 20 U/L (ref 0–44)
AST: 22 U/L (ref 15–41)
Albumin: 4 g/dL (ref 3.5–5.0)
Alkaline Phosphatase: 71 U/L (ref 38–126)
Anion gap: 11 (ref 5–15)
BUN: 29 mg/dL — ABNORMAL HIGH (ref 8–23)
CO2: 24 mmol/L (ref 22–32)
Calcium: 9.3 mg/dL (ref 8.9–10.3)
Chloride: 105 mmol/L (ref 98–111)
Creatinine, Ser: 2.5 mg/dL — ABNORMAL HIGH (ref 0.61–1.24)
GFR, Estimated: 26 mL/min — ABNORMAL LOW
Glucose, Bld: 178 mg/dL — ABNORMAL HIGH (ref 70–99)
Potassium: 5.2 mmol/L — ABNORMAL HIGH (ref 3.5–5.1)
Sodium: 140 mmol/L (ref 135–145)
Total Bilirubin: 0.4 mg/dL (ref 0.0–1.2)
Total Protein: 6.5 g/dL (ref 6.5–8.1)

## 2024-06-07 LAB — MAGNESIUM: Magnesium: 1.6 mg/dL — ABNORMAL LOW (ref 1.7–2.4)

## 2024-06-07 LAB — TROPONIN T, HIGH SENSITIVITY
Troponin T High Sensitivity: 58 ng/L — ABNORMAL HIGH (ref 0–19)
Troponin T High Sensitivity: 60 ng/L — ABNORMAL HIGH (ref 0–19)

## 2024-06-07 MED ORDER — MAGNESIUM SULFATE 2 GM/50ML IV SOLN
2.0000 g | Freq: Once | INTRAVENOUS | Status: AC
  Administered 2024-06-07: 2 g via INTRAVENOUS
  Filled 2024-06-07: qty 50

## 2024-06-07 MED ORDER — ACETAMINOPHEN 325 MG PO TABS
650.0000 mg | ORAL_TABLET | Freq: Once | ORAL | Status: AC
  Administered 2024-06-07: 650 mg via ORAL
  Filled 2024-06-07: qty 2

## 2024-06-07 MED ORDER — SODIUM CHLORIDE 0.9 % IV BOLUS
500.0000 mL | Freq: Once | INTRAVENOUS | Status: AC
  Administered 2024-06-07: 500 mL via INTRAVENOUS

## 2024-06-07 MED ORDER — SODIUM ZIRCONIUM CYCLOSILICATE 5 G PO PACK
10.0000 g | PACK | Freq: Once | ORAL | Status: AC
  Administered 2024-06-07: 10 g via ORAL
  Filled 2024-06-07: qty 2

## 2024-06-07 NOTE — Discharge Instructions (Signed)
 Your test results today were overall reassuring.  Your potassium was slightly high and your magnesium  was slightly low.  You were given medication to correct this.  On your imaging studies, no new findings were identified.  Your cardiac enzymes were slightly elevated but stable on repeat.  They are likely chronically slightly elevated.  Given your syncopal episode today, you would benefit from cardiology follow-up.  A referral has been ordered.  If you do not hear from the cardiology office in the next couple days, call the telephone number below to set up that follow-up appointment.  If you have any further episodes of concern, return to the emergency department.

## 2024-06-07 NOTE — ED Triage Notes (Signed)
 Pt in by [facility] for a near syncopal episode. Pt states he got up out of bed too fast and felt light headed and fell to the ground. Did hit his head. Does not take blood thinners. Denies LOC. Cbg 138 w/ ems.

## 2024-06-07 NOTE — ED Provider Notes (Signed)
 " Jim [name]   CSN: [id number] Arrival date & time: 06/07/24  1007     Patient presents with: Near Syncope   Jim [name] is a 74 y.o. male.   HPI Patient presents for syncope.  Medical history includes HTN, HLD, CVA, GERD, DM, depression, CKD.  Patient woke up this morning with dizziness.  He has not yet taken any of his morning medications.  Shortly prior to arrival, patient had a syncopal episode with a prodrome of lightheadedness.  He did fall forward and did strike his head.  He is unsure of how long he was on the ground for.  He lives with his wife but she did not witness the episode.  EMS was called to his home.  Patient has been alert and oriented with EMS.  He did not have any orthostatic hypotension when they stood him up on scene.  Patient endorses a mild headache but denies any other areas of discomfort.  He is not on any blood thinners.  He has never seen a cardiologist.   Prior to Admission medications  Medication Sig Start Date End Date Taking? Authorizing Provider  amLODipine  (NORVASC ) 10 MG tablet Take 10 mg by mouth daily.    [provider]  aspirin  EC 81 MG tablet Take 162 mg by mouth daily.    [provider]  atorvastatin  (LIPITOR ) 80 MG tablet Take 1 tablet (80 mg total) by mouth daily at 6 PM. 09/08/14   Ladora Coy, MD  benzonatate  (TESSALON ) 100 MG capsule Take 1 capsule (100 mg total) by mouth every 8 (eight) hours. 08/07/22   Theotis Cameron HERO, PA-C  cetirizine (ZYRTEC) 10 MG tablet Take 10 mg by mouth daily.    [provider]  diclofenac sodium (VOLTAREN) 1 % GEL Apply 2 g topically every 6 (six) hours as needed.    [provider]  docusate sodium  (COLACE) 100 MG capsule Take 100 mg by mouth 2 (two) times daily.    [provider]  famotidine  (PEPCID ) 20 MG tablet Take 1 tablet (20 mg total) by mouth 2 (two) times daily. 11/15/19   Bero, Michael M, MD   guaiFENesin  (MUCINEX ) 600 MG 12 hr tablet Take 1 tablet (600 mg total) by mouth 2 (two) times daily as needed. 11/15/19   Bero, Michael M, MD  hydrochlorothiazide  (HYDRODIURIL ) 25 MG tablet Take 25 mg by mouth daily.    [provider]  insulin  detemir (LEVEMIR ) 100 UNIT/ML injection Inject 0.13 mLs (13 Units total) into the skin daily. Patient taking differently: Inject 25 Units into the skin 2 (two) times daily.  09/07/14   Black, Darice HERO, NP  JARDIANCE 10 MG TABS tablet Take 10 mg by mouth daily. 10/07/19   [provider]  losartan  (COZAAR ) 25 MG tablet Take 25 mg by mouth daily. 08/17/19   [provider]  metFORMIN  (GLUCOPHAGE -XR) 500 MG 24 hr tablet Take 1 tablet (500 mg total) by mouth 2 (two) times daily. Patient taking differently: Take 1,000 mg by mouth 2 (two) times daily.  10/14/14   Antoinette Doe, MD  metoprolol  succinate (TOPROL -XL) 50 MG 24 hr tablet Take 1 tablet (50 mg total) by mouth daily. Patient taking differently: Take 200 mg by mouth daily.  09/08/14   Ladora Coy, MD  omeprazole (PRILOSEC) 20 MG capsule Take 20 mg by mouth daily.    [provider]  polyethylene glycol-electrolytes (TRILYTE) 420 g solution Take 4,000 mLs by  mouth as directed. Patient not taking: Reported on 11/15/2019 02/21/16   Shaaron Lamar HERO, MD  traMADol  (ULTRAM ) 50 MG tablet Take 1 tablet (50 mg total) by mouth every 6 (six) hours as needed. Patient not taking: Reported on 11/15/2019 09/02/19   Zammit, Joseph, MD    Allergies: Patient has no known allergies.    Review of Systems  Neurological:  Positive for syncope and headaches.  All other systems reviewed and are negative.   Updated Vital Signs BP (!) 135/95 (BP Location: Left Arm)   Pulse 64   Temp 98 F (36.7 C) (Oral)   Resp 12   Ht 5' 8 (1.727 m)   Wt 84.4 kg   SpO2 99%   BMI 28.28 kg/m   Physical Exam Vitals and nursing [name] reviewed.  Constitutional:      General: He is not in acute distress.     Appearance: Normal appearance. He is well-developed. He is not ill-appearing, toxic-appearing or diaphoretic.  HENT:     Head: Normocephalic.     Comments: Slight abrasion to left side of forehead.    Right Ear: External ear normal.     Left Ear: External ear normal.     Nose: Nose normal.     Mouth/Throat:     Mouth: Mucous membranes are moist.  Eyes:     Extraocular Movements: Extraocular movements intact.     Conjunctiva/sclera: Conjunctivae normal.  Cardiovascular:     Rate and Rhythm: Normal rate and regular rhythm.  Pulmonary:     Effort: Pulmonary effort is normal. No respiratory distress.  Chest:     Chest wall: No tenderness.  Abdominal:     General: There is no distension.     Palpations: Abdomen is soft.     Tenderness: There is no abdominal tenderness.  Musculoskeletal:        General: No swelling. Normal range of motion.     Cervical back: Normal range of motion and neck supple.  Skin:    General: Skin is warm and dry.     Coloration: Skin is not jaundiced or pale.  Neurological:     Mental Status: He is alert and oriented to person, place, and time.     Cranial Nerves: No cranial nerve deficit.     Sensory: No sensory deficit.     Coordination: Coordination normal.     Comments: Slight weakness in left leg when compared to right.  Patient states that this is baseline for him.  Psychiatric:        Mood and Affect: Mood normal.        Behavior: Behavior normal.     (all labs ordered are listed, but only abnormal results are displayed) Labs Reviewed  CBC WITH DIFFERENTIAL/PLATELET - Abnormal; Notable for the following components:      Result Value   Hemoglobin 11.8 (*)    HCT 37.5 (*)    All other components within normal limits  MAGNESIUM  - Abnormal; Notable for the following components:   Magnesium  1.6 (*)    All other components within normal limits  COMPREHENSIVE METABOLIC PANEL WITH GFR - Abnormal; Notable for the following components:   Potassium  5.2 (*)    Glucose, Bld 178 (*)    BUN 29 (*)    Creatinine, Ser 2.50 (*)    GFR, Estimated 26 (*)    All other components within normal limits  URINALYSIS, ROUTINE W REFLEX MICROSCOPIC - Abnormal; Notable for the following components:  Color, Urine STRAW (*)    Glucose, UA 150 (*)    Hgb urine dipstick SMALL (*)    Protein, ur 100 (*)    Leukocytes,Ua MODERATE (*)    All other components within normal limits  TROPONIN T, HIGH SENSITIVITY - Abnormal; Notable for the following components:   Troponin T High Sensitivity 58 (*)    All other components within normal limits  TROPONIN T, HIGH SENSITIVITY - Abnormal; Notable for the following components:   Troponin T High Sensitivity 60 (*)    All other components within normal limits  CBG MONITORING, ED    EKG: None  Radiology: CT CHEST ABDOMEN PELVIS WO CONTRAST Result Date: 06/07/2024 CLINICAL DATA:  Lightheadedness, syncope, fell EXAM: CT CHEST, ABDOMEN AND PELVIS WITHOUT CONTRAST TECHNIQUE: Multidetector CT imaging of the chest, abdomen and pelvis was performed following the standard protocol without IV contrast. Assessment of the vascular structures, soft tissues, and solid viscera is limited without contrast, especially in the setting of trauma. RADIATION DOSE REDUCTION: This exam was performed according to the departmental dose-optimization program which includes automated exposure control, adjustment of the mA and/or kV according to patient size and/or use of iterative reconstruction technique. COMPARISON:  06/07/2024 FINDINGS: CT CHEST FINDINGS Cardiovascular: Limited unenhanced imaging of the heart is unremarkable without pericardial effusion. Normal caliber of the thoracic aorta. Atherosclerosis of the aorta and coronary vasculature. Assessment of the vascular lumen cannot be performed without intravenous contrast. Mediastinum/Nodes: No enlarged mediastinal, hilar, or axillary lymph nodes. Thyroid gland, trachea, and esophagus  demonstrate no significant findings. Small hiatal hernia. Lungs/Pleura: No acute airspace disease, effusion, or pneumothorax. Central airways are patent. Musculoskeletal: No acute or destructive bony abnormalities. Reconstructed images demonstrate no additional findings. CT ABDOMEN PELVIS FINDINGS Hepatobiliary: Unremarkable unenhanced appearance of the liver and gallbladder. Pancreas: Unremarkable unenhanced appearance. Spleen: Unremarkable unenhanced appearance. Adrenals/Urinary Tract: There are numerous bilateral simple renal cortical cysts. There is also a 2.2 cm hyperdense cyst lower pole right kidney, measuring 59 HU. No specific imaging follow-up is recommended. No urinary tract calculi or obstructive uropathy. The adrenals and bladder are unremarkable. Stomach/Bowel: No bowel obstruction or ileus. Normal appendix right lower quadrant. Scattered colonic diverticulosis without diverticulitis. No bowel wall thickening or inflammatory change. Small hiatal hernia. Vascular/Lymphatic: Aortic atherosclerosis. No enlarged abdominal or pelvic lymph nodes. Reproductive: Prior prostatectomy. No abnormalities within the prostate bed. Other: No free fluid or free intraperitoneal gas. No abdominal wall hernia. Musculoskeletal: No acute or destructive bony abnormalities. Reconstructed images demonstrate no additional findings. IMPRESSION: 1. No evidence of acute intrathoracic, intra-abdominal, or intrapelvic trauma on this unenhanced exam. 2. Small hiatal hernia. 3. Aortic Atherosclerosis (ICD10-I70.0). Coronary artery atherosclerosis. 4. Scattered colonic diverticulosis without diverticulitis. Electronically Signed   By: Ozell Daring M.D.   On: 06/07/2024 15:06   MR ANGIO HEAD WO CONTRAST Result Date: 06/07/2024 CLINICAL DATA:  TIA EXAM: MRA HEAD WITHOUT CONTRAST TECHNIQUE: Angiographic images of the Circle of Willis were acquired using MRA technique without intravenous contrast. COMPARISON:  None Available.  FINDINGS: MRA intracranial: Right-side: The internal carotid artery, anterior and middle cerebral arteries are patent without significant proximal stenosis or branch occlusion. Left side: The internal carotid artery, anterior and middle cerebral arteries are patent without significant stenosis or proximal branch occlusion. Posterior circulation: The right vertebral artery is dominant. The left terminates as PICA. There is mild diffuse atherosclerosis of the basilar artery without significant focal stenosis. There is intracranial atherosclerosis of the posterior cerebral arteries. Other comments: None IMPRESSION: Mild diffuse  intracranial atherosclerosis involving the basilar artery. Intracranial atherosclerosis with bilateral posterior cerebral artery stenosis. No proximal vessel stenosis or branch occlusion involving the anterior circulation. Electronically Signed   By: Nancyann Burns M.D.   On: 06/07/2024 12:30   MR BRAIN WO CONTRAST Result Date: 06/07/2024 CLINICAL DATA:  TIA EXAM: MRI HEAD WITHOUT CONTRAST TECHNIQUE: Multiplanar, multiecho pulse sequences of the brain and surrounding structures were obtained without intravenous contrast. COMPARISON:  Oct 12, 2014 FINDINGS: MRI brain: There is diffuse cerebral atrophy. There are extensive and confluent foci of T2 hyperintensity in the cerebral white matter. These do not have restricted diffusion. There are old lacunar infarcts present in the left frontal white matter, left lentiform nucleus and right-side of the thalamus. There is an old cortical infarct in the medial right parietal lobe. There is an old infarct in the right-side of the pons. There is no acute infarct. No hydrocephalus. No mass lesion. There are normal flow signals in the carotid arteries and basilar artery. No significant bone marrow signal abnormality. No significant abnormality in the paranasal sinuses or soft tissues. IMPRESSION: Extensive chronic white matter disease and cerebral atrophy.  Multiple old lacunar infarcts. No acute infarct. Electronically Signed   By: Nancyann Burns M.D.   On: 06/07/2024 12:26   DG Pelvis Portable Result Date: 06/07/2024 EXAM: 1 or 2 VIEW(S) XRAY OF THE PELVIS 06/07/2024 11:56:00 AM COMPARISON: None available. CLINICAL HISTORY: Status post fall after syncope episode. FINDINGS: BONES AND JOINTS: No acute fracture. No malalignment. Lower lumbar degenerative changes. Mild bilateral hip degenerative changes. SOFT TISSUES: Vascular calcifications. IMPRESSION: 1. No acute findings. Electronically signed by: Waddell Calk MD MD 06/07/2024 12:25 PM EST RP Workstation: [id number]   DG Chest Portable 1 View Result Date: 06/07/2024 EXAM: 1 VIEW(S) XRAY OF THE CHEST 06/07/2024 11:55:00 AM COMPARISON: 11/15/2019 CLINICAL HISTORY: The patient experienced syncope. FINDINGS: LUNGS AND PLEURA: No focal pulmonary opacity. No pleural effusion. No pneumothorax. HEART AND MEDIASTINUM: Calcified aorta. No acute abnormality of the cardiac and mediastinal silhouettes. BONES AND SOFT TISSUES: Thoracic degenerative changes. IMPRESSION: 1. No acute findings. Electronically signed by: Waddell Calk MD MD 06/07/2024 12:22 PM EST RP Workstation: [id number]   CT CERVICAL SPINE WO CONTRAST Result Date: 06/07/2024 CLINICAL DATA:  Neck trauma.  Near syncopal episode with fall. EXAM: CT CERVICAL SPINE WITHOUT CONTRAST TECHNIQUE: Multidetector CT imaging of the cervical spine was performed without intravenous contrast. Multiplanar CT image reconstructions were also generated. RADIATION DOSE REDUCTION: This exam was performed according to the departmental dose-optimization program which includes automated exposure control, adjustment of the mA and/or kV according to patient size and/or use of iterative reconstruction technique. COMPARISON:  None Available. FINDINGS: Alignment: Straightening without focal angulation or listhesis. Skull base and vertebrae: No evidence of acute cervical spine fracture  or traumatic subluxation. Soft tissues and spinal canal: No prevertebral fluid or swelling. No visible canal hematoma. Disc levels: Multilevel spondylosis with disc space narrowing, uncinate spurring and facet hypertrophy, most advanced at C4-5, C5-6 and C6-7. Resulting mild-to-moderate spinal stenosis at multiple levels. In addition, there is mild osseous foraminal narrowing at several levels. No large disc herniation identified. Upper chest: Clear lung apices. Other: Bilateral carotid atherosclerosis. IMPRESSION: 1. No evidence of acute cervical spine fracture, traumatic subluxation or static signs of instability. 2. Multilevel cervical spondylosis as described. Electronically Signed   By: Elsie Perone M.D.   On: 06/07/2024 11:59     Procedures   Medications Ordered in the ED  acetaminophen  (TYLENOL ) tablet 650 mg (650  mg Oral Given 06/07/24 1048)  magnesium  sulfate IVPB 2 g 50 mL (0 g Intravenous Stopped 06/07/24 1427)  sodium chloride  0.9 % bolus 500 mL (500 mLs Intravenous Bolus 06/07/24 1436)  sodium zirconium cyclosilicate  (LOKELMA ) packet 10 g (10 g Oral Given 06/07/24 1437)                                    Medical Decision Making Amount and/or Complexity of Data Reviewed Labs: ordered. Radiology: ordered.  Risk OTC drugs. Prescription drug management.   This patient presents to the ED for concern of syncope, this involves an extensive number of treatment options, and is a complaint that carries with it a high risk of complications and morbidity.  The differential diagnosis includes arrhythmia, vasovagal episode, orthostatic hypotension, dehydration, anemia, TIA, seizure   Co morbidities / Chronic conditions that complicate the patient evaluation  HTN, HLD, CVA, GERD, DM, depression, CKD   Additional history obtained:  Additional history obtained from EMR External records from outside source obtained and reviewed including EMS   Lab Tests:  I Ordered, and personally  interpreted labs.  The pertinent results include: Hemoglobin, no leukocytosis, baseline creatinine, slight hyperkalemia and hypomagnesemia with otherwise normal electrolytes, mildly elevated troponin that is stable on repeat.  Urinalysis does not show evidence of UTI.   Imaging Studies ordered:  I ordered imaging studies including MRI/MRA brain, CT of cervical spine, x-ray of chest and pelvis, CT of chest, abdomen, pelvis I independently visualized and interpreted imaging which showed no acute findings I agree with the radiologist interpretation   Cardiac Monitoring: / EKG:  The patient was maintained on a cardiac monitor.  I personally viewed and interpreted the cardiac monitored which showed an underlying rhythm of: Sinus rhythm   Problem List / ED Course / Critical interventions / Medication management  Patient presenting after a syncope and fall.  This occurred at home, shortly prior to arrival.  Patient reports that he did have some dizziness throughout the morning today and did have a episode of lightheadedness prior to his syncope.  On arrival in the ED, he is alert and oriented.  He has a mild abrasion to the left side of forehead.  He endorses mild headache but denies any other areas of discomfort.  He does have some mild abdominal tenderness that is generalized.  With straight leg raise, he is slightly weaker on the left leg which she states is normal for him.  Tylenol  was ordered for analgesia.  Workup was initiated.  Lab work notable for mild hypokalemia and hypomagnesemia.  Magnesium  sulfate and Lokelma  were ordered.  He troponin was mildly elevated but is stable on repeat.  I suspect chronic slight elevation.  On imaging studies, no acute findings were identified.  Patient remained in normal sinus rhythm and asymptomatic while in the emergency department.  A shared decision making discussion was had with the patient.  Patient was offered admission for syncope observation, however, he  declined stating that he would prefer to go home.  He is agreeable to follow-up with cardiology.  Cardiology referral was ordered.  Patient was discharged in stable condition. I ordered medication including Tylenol  for analgesia, magnesium  sulfate and Lokelma  for electrolyte optimization; IV fluids for hydration Reevaluation of the patient after these medicines showed that the patient improved I have reviewed the patients home medicines and have made adjustments as needed  Social Determinants of Health:  Lives at home with family     Final diagnoses:  Syncope and collapse    ED Discharge Orders          Ordered    Ambulatory referral to Cardiology       Comments: If you have not heard from the Cardiology office within the next 72 hours please call [phone].   06/07/24 1600               Melvenia Motto, MD 06/07/24 1603  "

## 2024-08-19 ENCOUNTER — Ambulatory Visit: Admitting: Cardiovascular Disease
